# Patient Record
Sex: Female | Born: 1980 | Race: Black or African American | Hispanic: No | Marital: Single | Smoking: Former smoker
Health system: Southern US, Community
[De-identification: ages and names within clinical notes are randomized; demographics above are authoritative.]

## PROBLEM LIST (undated history)

## (undated) DIAGNOSIS — T8149XA Infection following a procedure, other surgical site, initial encounter: Secondary | ICD-10-CM

## (undated) DIAGNOSIS — O1002 Pre-existing essential hypertension complicating childbirth: Secondary | ICD-10-CM

## (undated) DIAGNOSIS — O24419 Gestational diabetes mellitus in pregnancy, unspecified control: Secondary | ICD-10-CM

## (undated) DIAGNOSIS — Q828 Other specified congenital malformations of skin: Secondary | ICD-10-CM

## (undated) DIAGNOSIS — R011 Cardiac murmur, unspecified: Secondary | ICD-10-CM

## (undated) DIAGNOSIS — N946 Dysmenorrhea, unspecified: Secondary | ICD-10-CM

## (undated) DIAGNOSIS — K5900 Constipation, unspecified: Secondary | ICD-10-CM

## (undated) DIAGNOSIS — Z3490 Encounter for supervision of normal pregnancy, unspecified, unspecified trimester: Secondary | ICD-10-CM

## (undated) DIAGNOSIS — O139 Gestational [pregnancy-induced] hypertension without significant proteinuria, unspecified trimester: Secondary | ICD-10-CM

## (undated) DIAGNOSIS — Z319 Encounter for procreative management, unspecified: Secondary | ICD-10-CM

## (undated) HISTORY — DX: Dysmenorrhea, unspecified: N94.6

## (undated) HISTORY — DX: Pre-existing essential hypertension complicating childbirth: O10.02

## (undated) HISTORY — DX: Infection following a procedure, other surgical site, initial encounter: T81.49XA

## (undated) HISTORY — DX: Encounter for procreative management, unspecified: Z31.9

## (undated) HISTORY — DX: Encounter for supervision of normal pregnancy, unspecified, unspecified trimester: Z34.90

## (undated) HISTORY — DX: Gestational (pregnancy-induced) hypertension without significant proteinuria, unspecified trimester: O13.9

## (undated) HISTORY — DX: Cardiac murmur, unspecified: R01.1

## (undated) HISTORY — DX: Constipation, unspecified: K59.00

## (undated) HISTORY — DX: Other specified congenital malformations of skin: Q82.8

## (undated) HISTORY — DX: Gestational diabetes mellitus in pregnancy, unspecified control: O24.419

## (undated) HISTORY — PX: OTHER SURGICAL HISTORY: SHX169

---

## 2001-06-11 ENCOUNTER — Emergency Department (HOSPITAL_COMMUNITY): Admission: EM | Admit: 2001-06-11 | Discharge: 2001-06-11 | Payer: Self-pay | Admitting: Internal Medicine

## 2001-08-04 ENCOUNTER — Other Ambulatory Visit: Admission: RE | Admit: 2001-08-04 | Discharge: 2001-08-04 | Payer: Self-pay | Admitting: Obstetrics and Gynecology

## 2003-04-25 ENCOUNTER — Other Ambulatory Visit: Admission: RE | Admit: 2003-04-25 | Discharge: 2003-04-25 | Payer: Self-pay

## 2012-06-07 ENCOUNTER — Other Ambulatory Visit: Payer: Self-pay | Admitting: Obstetrics & Gynecology

## 2012-06-07 DIAGNOSIS — O3680X0 Pregnancy with inconclusive fetal viability, not applicable or unspecified: Secondary | ICD-10-CM

## 2012-06-10 ENCOUNTER — Ambulatory Visit (INDEPENDENT_AMBULATORY_CARE_PROVIDER_SITE_OTHER): Payer: Medicaid Other

## 2012-06-10 DIAGNOSIS — O3680X0 Pregnancy with inconclusive fetal viability, not applicable or unspecified: Secondary | ICD-10-CM

## 2012-06-10 NOTE — Progress Notes (Signed)
U/S (7+2wks)-single IUP with +FCA noted, CRL c/w dates, cx long and closed, ??pelvic kidney noted on lt 4.5cm area noted with hyperechoic central appearance with +Doppler flow , rt ovary noted, unable to identify lt ovary,

## 2012-06-13 LAB — US OB COMP LESS 14 WKS

## 2012-06-21 ENCOUNTER — Encounter: Payer: Self-pay | Admitting: Adult Health

## 2012-06-21 ENCOUNTER — Ambulatory Visit (INDEPENDENT_AMBULATORY_CARE_PROVIDER_SITE_OTHER): Payer: Medicaid Other | Admitting: Adult Health

## 2012-06-21 VITALS — BP 120/72 | Ht 67.0 in | Wt 200.0 lb

## 2012-06-21 DIAGNOSIS — Z3401 Encounter for supervision of normal first pregnancy, first trimester: Secondary | ICD-10-CM

## 2012-06-21 DIAGNOSIS — Z3400 Encounter for supervision of normal first pregnancy, unspecified trimester: Secondary | ICD-10-CM

## 2012-06-21 DIAGNOSIS — Z3490 Encounter for supervision of normal pregnancy, unspecified, unspecified trimester: Secondary | ICD-10-CM

## 2012-06-21 HISTORY — DX: Encounter for supervision of normal pregnancy, unspecified, unspecified trimester: Z34.90

## 2012-06-21 NOTE — Progress Notes (Signed)
  Subjective:    Donna Moore is a G1P0 [redacted]w[redacted]d being seen today for her first obstetrical visit.  Her obstetrical history is significant for none.  Pregnancy history fully reviewed.  Patient reports nausea.  Filed Vitals:   06/21/12 1352 06/21/12 1358  BP: 120/72   Height:  5\' 7"  (1.702 m)  Weight: 200 lb (90.719 kg)     HISTORY: OB History   Grav Para Term Preterm Abortions TAB SAB Ect Mult Living   1              # Outc Date GA Lbr Len/2nd Wgt Sex Del Anes PTL Lv   1 CUR              Past Medical History  Diagnosis Date  . Heart murmur   . Pregnancy 06/21/2012   Past Surgical History  Procedure Laterality Date  . Cyst drained      Emergency surg, and in hospital for 1 week.   Family History  Problem Relation Age of Onset  . Hypertension Mother   . Diabetes Mother   . Hypertension Father   . Diabetes Brother      Exam       Pelvic Exam:    Perineum: deferred   Vulva: deferred   Vagina:  deferred   Uterus         Cervix: deferred   Adnexa: deferred   Urinary:  deferred    System: Breast:  deferred   Skin: normal coloration and turgor, no rashes    Neurologic: oriented, normal, normal mood   Extremities: normal strength, tone, and muscle mass   HEENT PERRLA   Mouth/Teeth mucous membranes moist, pharynx normal without lesions   Neck supple and no masses   Cardiovascular: regular rate and rhythm   Respiratory:  appears well, vitals normal, no respiratory distress, acyanotic, normal RR   Abdomen: soft, non-tender; bowel sounds normal; no masses,  no organomegaly          Assessment:    Pregnancy: G1P0 Patient Active Problem List   Diagnosis Date Noted  . Pregnancy 06/21/2012        Plan:    Declines meds for nausea Initial labs drawn. Prenatal vitamins. Problem list reviewed and updated. Genetic Screening discussed Integrated Screen: requested.  Ultrasound discussed; fetal survey: requested.  Follow up in 3  weeks.  GRIFFIN,JENNIFER 06/21/2012

## 2012-06-21 NOTE — Patient Instructions (Addendum)
Follow up in 3 weeks for IT/NT Morning Sickness Morning sickness is when you feel sick to your stomach (nauseous) during pregnancy. This nauseous feeling may or may not come with throwing up (vomiting). It often occurs in the morning, but can be a problem any time of day. While morning sickness is unpleasant, it is usually harmless unless you develop severe and continual vomiting (hyperemesis gravidarum). This condition requires more intense treatment. CAUSES  The cause of morning sickness is not completely known but seems to be related to a sudden increase of two hormones:   Human chorionic gonadotropin (hCG).  Estrogen hormone. These are elevated in the first part of the pregnancy. TREATMENT  Do not use any medicines (prescription, over-the-counter, or herbal) for morning sickness without first talking to your caregiver. Some patients are helped by the following:  Vitamin B6 (25mg  every 8 hours) or vitamin B6 shots.  An antihistamine called doxylamine (10mg  every 8 hours).  The herbal medication ginger. HOME CARE INSTRUCTIONS   Taking multivitamins before getting pregnant can prevent or decrease the severity of morning sickness in most women.  Eat a piece of dry toast or unsalted crackers before getting out of bed in the morning.  Eat 5 or 6 small meals a day.  Eat dry and bland foods (rice, baked potato).  Do not drink liquids with your meals. Drink liquids between meals.  Avoid greasy, fatty, and spicy foods.  Get someone to cook for you if the smell of any food causes nausea and vomiting.  Avoid vitamin pills with iron because iron can cause nausea.  Snack on protein foods between meals if you are hungry.  Eat unsweetened gelatins for deserts.  Wear an acupressure wristband (worn for sea sickness) may be helpful.  Acupuncture may be helpful.  Do not smoke.  Get a humidifier to keep the air in your house free of odors. SEEK MEDICAL CARE IF:   Your home remedies  are not working and you need medication.  You feel dizzy or lightheaded.  You are losing weight.  You need help with your diet. SEEK IMMEDIATE MEDICAL CARE IF:   You have persistent and uncontrolled nausea and vomiting.  You pass out (faint).  You have a fever. MAKE SURE YOU:   Understand these instructions.  Will watch your condition.  Will get help right away if you are not doing well or get worse. Document Released: 03/27/2006 Document Revised: 04/28/2011 Document Reviewed: 01/22/2007 [facility] Patient Information 2013 [place], [place]. Sign for my chart

## 2012-06-21 NOTE — Progress Notes (Signed)
Pt in today for New OB appointment. Pt was given [facility] form and Lab consents to read over and sign. Pt has no complaints at this time.

## 2012-06-22 LAB — CBC
HCT: 35.8 % — ABNORMAL LOW (ref 36.0–46.0)
Platelets: 304 10*3/uL (ref 150–400)
RDW: 15.7 % — ABNORMAL HIGH (ref 11.5–15.5)
WBC: 10 10*3/uL (ref 4.0–10.5)

## 2012-06-22 LAB — HEPATITIS B SURFACE ANTIGEN: Hepatitis B Surface Ag: NEGATIVE

## 2012-06-22 LAB — HIV ANTIBODY (ROUTINE TESTING W REFLEX): HIV: NONREACTIVE

## 2012-06-22 LAB — ABO AND RH

## 2012-06-22 LAB — TSH: TSH: 1.113 u[IU]/mL (ref 0.350–4.500)

## 2012-06-22 LAB — RUBELLA SCREEN: Rubella: 4.41 Index — ABNORMAL HIGH (ref <0.90)

## 2012-06-23 ENCOUNTER — Other Ambulatory Visit: Payer: Self-pay | Admitting: Obstetrics & Gynecology

## 2012-06-23 DIAGNOSIS — Z36 Encounter for antenatal screening of mother: Secondary | ICD-10-CM

## 2012-07-13 ENCOUNTER — Other Ambulatory Visit: Payer: Self-pay

## 2012-07-13 ENCOUNTER — Encounter: Payer: Self-pay | Admitting: Obstetrics and Gynecology

## 2012-07-14 ENCOUNTER — Ambulatory Visit (INDEPENDENT_AMBULATORY_CARE_PROVIDER_SITE_OTHER): Payer: Medicaid Other

## 2012-07-14 ENCOUNTER — Other Ambulatory Visit: Payer: Self-pay | Admitting: Obstetrics & Gynecology

## 2012-07-14 ENCOUNTER — Ambulatory Visit (INDEPENDENT_AMBULATORY_CARE_PROVIDER_SITE_OTHER): Payer: Medicaid Other | Admitting: Obstetrics & Gynecology

## 2012-07-14 ENCOUNTER — Encounter: Payer: Self-pay | Admitting: Obstetrics & Gynecology

## 2012-07-14 VITALS — BP 120/80 | Wt 206.0 lb

## 2012-07-14 DIAGNOSIS — Z331 Pregnant state, incidental: Secondary | ICD-10-CM

## 2012-07-14 DIAGNOSIS — Z3400 Encounter for supervision of normal first pregnancy, unspecified trimester: Secondary | ICD-10-CM

## 2012-07-14 DIAGNOSIS — Z1389 Encounter for screening for other disorder: Secondary | ICD-10-CM

## 2012-07-14 DIAGNOSIS — Z36 Encounter for antenatal screening of mother: Secondary | ICD-10-CM

## 2012-07-14 DIAGNOSIS — Z3401 Encounter for supervision of normal first pregnancy, first trimester: Secondary | ICD-10-CM

## 2012-07-14 LAB — POCT URINALYSIS DIPSTICK
Glucose, UA: NEGATIVE
Nitrite, UA: NEGATIVE

## 2012-07-14 MED ORDER — PRENATAL PLUS 27-1 MG PO TABS: 1.0000 | ORAL_TABLET | Freq: Every day | ORAL | Status: DC

## 2012-07-14 NOTE — Addendum Note (Signed)
Addended by: Colen Darling on: 07/14/2012 03:27 PM   Modules accepted: Orders

## 2012-07-14 NOTE — Progress Notes (Signed)
No bleeding no problems NT sono noted and all WNL First IT today

## 2012-07-14 NOTE — Progress Notes (Signed)
U/S-12+1wks-single IUP with +FCA noted, CRL c/w dates, cx long and closed, bilateral adnexa wnl, NB present, NT=1.48mm

## 2012-07-14 NOTE — Progress Notes (Signed)
1st IT today.

## 2012-07-15 LAB — URINALYSIS
Leukocytes, UA: NEGATIVE
Nitrite: NEGATIVE
Protein, ur: NEGATIVE mg/dL
Urobilinogen, UA: 0.2 mg/dL (ref 0.0–1.0)

## 2012-07-15 LAB — DRUG SCREEN, URINE, NO CONFIRMATION
Amphetamine Screen, Ur: NEGATIVE
Barbiturate Quant, Ur: NEGATIVE
Marijuana Metabolite: NEGATIVE
Methadone: NEGATIVE

## 2012-07-16 LAB — URINE CULTURE: Colony Count: NO GROWTH

## 2012-07-19 LAB — MATERNAL SCREEN, INTEGRATED #1

## 2012-08-11 ENCOUNTER — Encounter: Payer: Self-pay | Admitting: Obstetrics & Gynecology

## 2012-08-11 ENCOUNTER — Ambulatory Visit (INDEPENDENT_AMBULATORY_CARE_PROVIDER_SITE_OTHER): Payer: Medicaid Other | Admitting: Obstetrics & Gynecology

## 2012-08-11 ENCOUNTER — Other Ambulatory Visit: Payer: Self-pay | Admitting: Obstetrics & Gynecology

## 2012-08-11 VITALS — BP 144/78 | Wt 217.0 lb

## 2012-08-11 DIAGNOSIS — Z331 Pregnant state, incidental: Secondary | ICD-10-CM

## 2012-08-11 DIAGNOSIS — Z1389 Encounter for screening for other disorder: Secondary | ICD-10-CM

## 2012-08-11 DIAGNOSIS — Z3400 Encounter for supervision of normal first pregnancy, unspecified trimester: Secondary | ICD-10-CM

## 2012-08-11 LAB — POCT URINALYSIS DIPSTICK
Blood, UA: NEGATIVE
Glucose, UA: NEGATIVE
Ketones, UA: NEGATIVE
Nitrite, UA: NEGATIVE

## 2012-08-11 NOTE — Progress Notes (Signed)
BP creeping a little BP weight and urine results all reviewed and noted. Patient reports good fetal movement, denies any bleeding and no rupture of membranes symptoms or regular contractions. Patient is without complaints. All questions were answered. Sonogram in 4 weeks

## 2012-08-11 NOTE — Progress Notes (Signed)
2 IT TODAY.

## 2012-08-11 NOTE — Patient Instructions (Signed)
How a Baby Grows During Pregnancy Pregnancy begins when the female's sperm enters the female's egg. This happens in the fallopian tube and is called fertilization. The fertilized egg is called an embryo until it reaches 9 weeks from the time of fertilization. From 9 weeks until birth it is called a fetus. The fertilized egg moves down the tube into the uterus and attaches to the inside lining of the uterus.  The pregnant woman is responsible for the growth of the embryo/fetus by supplying nourishment and oxygen through the blood stream and placenta to the developing fetus. The uterus becomes larger and pops out from the abdomen more and more as the fetus develops and grows. A normal pregnancy lasts 280 days, with a range of 259 to 294 days, or 40 weeks. The pregnancy is divided up into three trimesters:  First trimester - 0 to 13 weeks.  Second trimester - 14 to 27 weeks.  Third trimester - 28 to 40 weeks. The day your baby is supposed to be born is called estimated date of confinement (EDC) or estimated date of delivery (EDD). GROWTH OF THE BABY MONTH BY MONTH 1. First Month: The fertilized egg attaches to the inside of the uterus and certain cells will form the placenta and others will develop into the fetus. The arms, legs, brain, spinal cord, lungs, and heart begin to develop. At the end of the first month the heart begins to beat. The embryo weighs less than an ounce and is  inch long. 2. Second Month: The bones can be seen, the inner ear, eye lids, hands and feet form and genitals develop. By the end of 8 weeks, all of the major organs are developing. The fetus now weighs less than an ounce and is one inch (2.54 cm) long. 3. Third Month: Teeth buds appear, all the internal organs are forming, bones and muscles begin to grow, the spine can flex and the skin is transparent. Finger and toe nails begin to form, the hands develop faster than the feet and the arms are longer than the legs at this point.  The fetus weighs a little more than an ounce (0.03 kg) and is 3 inches (8.89cm) long. 4. Fourth Month: The placenta is completely formed. The external sex organs, neck, outer ear, eyebrows, eyelids and fingernails are formed. The fetus can hear, swallow, flex its arms and legs and the kidney begins to produce urine. The skin is covered with a white waxy coating (vernix) and very thin hair (lanugo) is present. The fetus weighs 5 ounces (0.14kg) and is 6 to 7 inches (16.51cm) long. 5. Fifth Month: The fetus moves around more and can be felt for the first time (called quickening), sleeps and wakes up at times, may begin to suck its finger and the nails grow to the end of the fingers. The gallbladder is now functioning and helps to digest the nutrients, eggs are formed in the female and the testicles begin to drop down from the abdomen to the scrotum in the female. The fetus weighs  to 1 pound (0.45kg) and is 10 inches (25.4cm) long. 6. Sixth Month: The lungs are formed but the fetus does not breath yet. The eyes open, the brain develops more quickly at this time, one can detect finger and toe prints and thicker hair grows. The fetus weighs 1 to 1 pounds (0.68kg) and is 12 inches (30.48cm) long. 7. Seventh Month: The fetus can hear and respond to sounds, kicks and stretches and can sense   changes in light. The fetus weighs 2 to 2 pounds (1.13kg) and is 14 inches (35.56cm) long. 8. Eight Month: All organs and body systems are fully developed and functioning. The bones get harder, taste buds develop and can taste sweet and sour flavors and the fetus may hiccup now. Different parts of the brain are developing and the skull remains soft for the brain to grow. The fetus weighs 5 pounds (2.27kg) and is 18 inches (45.75cm) long. 9. Ninth Month: The fetus gains about a half a pound a week, the lungs are fully developed, patterns of sleep develop and the head moves down into the bottom of the uterus called vertex. If the  buttocks moves into the bottom of the uterus, it is called a breech. The fetus weighs 6 to 9 pounds (2.72 to 4.08kg) and is 20 inches (50.8cm) long. You should be informed about your pregnancy, yourself and how the baby is developing as much as possible. Being informed helps you to enjoy this experience. It also gives you the sense to feel if something is not going right and when to ask questions. Talk to your caregiver when you have questions about your baby or your own body. Document Released: 07/23/2007 Document Revised: 04/28/2011 Document Reviewed: 07/23/2007 [employer] Patient Information 2014 [employer].  

## 2012-08-15 LAB — MATERNAL SCREEN, INTEGRATED #2
Age risk Down Syndrome: 1:510 {titer}
Calculated Gestational Age: 16.1
Inhibin A Dimeric: 108 pg/mL
Inhibin A MoM: 0.73
MSS Down Syndrome: <1:5000 {titer}
NT MoM: 1.05
Rish for ONTD: <1:5000 {titer}
hCG MoM: 0.71

## 2012-09-08 ENCOUNTER — Ambulatory Visit (INDEPENDENT_AMBULATORY_CARE_PROVIDER_SITE_OTHER): Payer: Medicaid Other

## 2012-09-08 ENCOUNTER — Encounter: Payer: Self-pay | Admitting: Obstetrics and Gynecology

## 2012-09-08 ENCOUNTER — Ambulatory Visit (INDEPENDENT_AMBULATORY_CARE_PROVIDER_SITE_OTHER): Payer: Medicaid Other | Admitting: Obstetrics and Gynecology

## 2012-09-08 ENCOUNTER — Other Ambulatory Visit: Payer: Self-pay | Admitting: Obstetrics & Gynecology

## 2012-09-08 VITALS — BP 136/80 | Wt 228.6 lb

## 2012-09-08 DIAGNOSIS — Z0371 Encounter for suspected problem with amniotic cavity and membrane ruled out: Secondary | ICD-10-CM

## 2012-09-08 DIAGNOSIS — Z331 Pregnant state, incidental: Secondary | ICD-10-CM

## 2012-09-08 DIAGNOSIS — Z1389 Encounter for screening for other disorder: Secondary | ICD-10-CM

## 2012-09-08 DIAGNOSIS — O99210 Obesity complicating pregnancy, unspecified trimester: Secondary | ICD-10-CM

## 2012-09-08 DIAGNOSIS — Z0375 Encounter for suspected cervical shortening ruled out: Secondary | ICD-10-CM

## 2012-09-08 LAB — POCT URINALYSIS DIPSTICK
Blood, UA: NEGATIVE
Ketones, UA: NEGATIVE

## 2012-09-08 NOTE — Progress Notes (Signed)
U/S(20+1wks)-active fetus, vtx, approp growth, meas c/w dates, fluid wnl, bilateral adnexa wnl, no major abnl noted, female fetus, cx  Closed, 3cm (measured vaginally)

## 2012-09-08 NOTE — Progress Notes (Signed)
Pt here today for routine visit and [place]. Pt denies any problems or concerns at this time.

## 2012-09-08 NOTE — Patient Instructions (Signed)
Baby requires 300 calories  A day

## 2012-09-27 ENCOUNTER — Ambulatory Visit (INDEPENDENT_AMBULATORY_CARE_PROVIDER_SITE_OTHER): Payer: Medicaid Other | Admitting: Obstetrics & Gynecology

## 2012-09-27 ENCOUNTER — Encounter: Payer: Self-pay | Admitting: Obstetrics & Gynecology

## 2012-09-27 ENCOUNTER — Telehealth: Payer: Self-pay | Admitting: *Deleted

## 2012-09-27 VITALS — BP 130/80 | Wt 234.0 lb

## 2012-09-27 DIAGNOSIS — K5900 Constipation, unspecified: Secondary | ICD-10-CM

## 2012-09-27 DIAGNOSIS — Z331 Pregnant state, incidental: Secondary | ICD-10-CM

## 2012-09-27 DIAGNOSIS — Z1389 Encounter for screening for other disorder: Secondary | ICD-10-CM

## 2012-09-27 DIAGNOSIS — O9989 Other specified diseases and conditions complicating pregnancy, childbirth and the puerperium: Secondary | ICD-10-CM

## 2012-09-27 LAB — POCT URINALYSIS DIPSTICK
Blood, UA: NEGATIVE
Glucose, UA: NEGATIVE

## 2012-09-27 MED ORDER — POLYETHYLENE GLYCOL 3350 17 GM/SCOOP PO POWD: 17.0000 g | Freq: Every day | ORAL | Status: DC

## 2012-09-27 NOTE — Telephone Encounter (Signed)
Spoke with pt. Was having upper abd pain and pressure over the weekend. Went to [facility] ER. Constipated and when she had a BM, it had blood and mucus in it. Blood in urine also. [facility] advised it was related to being constipated and to follow up with [place] today. Still having stomach cramps today. Last BM was Saturday. Appt scheduled for follow up today.

## 2012-09-27 NOTE — Addendum Note (Signed)
Addended by: Garfield Chiquito on: 09/27/2012 04:58 PM   Modules accepted: Orders

## 2012-09-27 NOTE — Progress Notes (Signed)
Went to [facility] on Friday. For constipation. Still having pain and cramping." states no fetal movement today.

## 2012-09-27 NOTE — Patient Instructions (Signed)

## 2012-09-27 NOTE — Progress Notes (Signed)
Fetus active during Doppler.  Recommend suppositories followed by MagCitrate acutely then miralax powder chronically, 1-4 scoops per day.

## 2012-10-05 ENCOUNTER — Encounter: Payer: Self-pay | Admitting: Women's Health

## 2012-10-05 ENCOUNTER — Ambulatory Visit (INDEPENDENT_AMBULATORY_CARE_PROVIDER_SITE_OTHER): Payer: Medicaid Other | Admitting: Women's Health

## 2012-10-05 VITALS — BP 142/80 | Wt 237.4 lb

## 2012-10-05 DIAGNOSIS — O0990 Supervision of high risk pregnancy, unspecified, unspecified trimester: Secondary | ICD-10-CM | POA: Insufficient documentation

## 2012-10-05 DIAGNOSIS — O10019 Pre-existing essential hypertension complicating pregnancy, unspecified trimester: Secondary | ICD-10-CM

## 2012-10-05 DIAGNOSIS — Z331 Pregnant state, incidental: Secondary | ICD-10-CM

## 2012-10-05 DIAGNOSIS — O10912 Unspecified pre-existing hypertension complicating pregnancy, second trimester: Secondary | ICD-10-CM

## 2012-10-05 DIAGNOSIS — O10919 Unspecified pre-existing hypertension complicating pregnancy, unspecified trimester: Secondary | ICD-10-CM | POA: Insufficient documentation

## 2012-10-05 DIAGNOSIS — Z1389 Encounter for screening for other disorder: Secondary | ICD-10-CM

## 2012-10-05 DIAGNOSIS — Z3402 Encounter for supervision of normal first pregnancy, second trimester: Secondary | ICD-10-CM

## 2012-10-05 LAB — POCT URINALYSIS DIPSTICK
Ketones, UA: NEGATIVE
Protein, UA: NEGATIVE

## 2012-10-05 NOTE — Progress Notes (Signed)
Reports good fm. Denies uc's, lof, vb, urinary frequency, urgency, hesitancy, or dysuria.  No complaints. States constipation much better. Reviewed BPs, probable CHTN and management, ptl s/s, fm.  Baseline 24hr urine ordered today. All questions answered. F/U in 4wks for PN2, u/s, and visit.

## 2012-10-05 NOTE — Patient Instructions (Addendum)
You will have your sugar test next visit.  Please do not eat or drink anything after midnight the night before you come, not even water.  You will be here for at least two hours.    Pregnancy - Second Trimester The second trimester of pregnancy (3 to 6 months) is a period of rapid growth for you and your baby. At the end of the sixth month, your baby is about 9 inches long and weighs 1 1/2 pounds. You will begin to feel the baby move between 18 and 20 weeks of the pregnancy. This is called quickening. Weight gain is faster. A clear fluid (colostrum) may leak out of your breasts. You may feel small contractions of the womb (uterus). This is known as false labor or Braxton-Hicks contractions. This is like a practice for labor when the baby is ready to be born. Usually, the problems with morning sickness have usually passed by the end of your first trimester. Some women develop small dark blotches (called cholasma, mask of pregnancy) on their face that usually goes away after the baby is born. Exposure to the sun makes the blotches worse. Acne may also develop in some pregnant women and pregnant women who have acne, may find that it goes away. PRENATAL EXAMS  Blood work may continue to be done during prenatal exams. These tests are done to check on your health and the probable health of your baby. Blood work is used to follow your blood levels (hemoglobin). Anemia (low hemoglobin) is common during pregnancy. Iron and vitamins are given to help prevent this. You will also be checked for diabetes between 24 and 28 weeks of the pregnancy. Some of the previous blood tests may be repeated.  The size of the uterus is measured during each visit. This is to make sure that the baby is continuing to grow properly according to the dates of the pregnancy.  Your blood pressure is checked every prenatal visit. This is to make sure you are not getting toxemia.  Your urine is checked to make sure you do not have an  infection, diabetes or protein in the urine.  Your weight is checked often to make sure gains are happening at the suggested rate. This is to ensure that both you and your baby are growing normally.  Sometimes, an ultrasound is performed to confirm the proper growth and development of the baby. This is a test which bounces harmless sound waves off the baby so your caregiver can more accurately determine due dates. Sometimes, a test is done on the amniotic fluid surrounding the baby. This test is called an amniocentesis. The amniotic fluid is obtained by sticking a needle into the belly (abdomen). This is done to check the chromosomes in instances where there is a concern about possible genetic problems with the baby. It is also sometimes done near the end of pregnancy if an early delivery is required. In this case, it is done to help make sure the baby's lungs are mature enough for the baby to live outside of the womb. CHANGES OCCURING IN THE SECOND TRIMESTER OF PREGNANCY Your body goes through many changes during pregnancy. They vary from person to person. Talk to your caregiver about changes you notice that you are concerned about.  During the second trimester, you will likely have an increase in your appetite. It is normal to have cravings for certain foods. This varies from person to person and pregnancy to pregnancy.  Your lower abdomen will begin to   bulge.  You may have to urinate more often because the uterus and baby are pressing on your bladder. It is also common to get more bladder infections during pregnancy. You can help this by drinking lots of fluids and emptying your bladder before and after intercourse.  You may begin to get stretch marks on your hips, abdomen, and breasts. These are normal changes in the body during pregnancy. There are no exercises or medicines to take that prevent this change.  You may begin to develop swollen and bulging veins (varicose veins) in your legs.  Wearing support hose, elevating your feet for 15 minutes, 3 to 4 times a day and limiting salt in your diet helps lessen the problem.  Heartburn may develop as the uterus grows and pushes up against the stomach. Antacids recommended by your caregiver helps with this problem. Also, eating smaller meals 4 to 5 times a day helps.  Constipation can be treated with a stool softener or adding bulk to your diet. Drinking lots of fluids, and eating vegetables, fruits, and whole grains are helpful.  Exercising is also helpful. If you have been very active up until your pregnancy, most of these activities can be continued during your pregnancy. If you have been less active, it is helpful to start an exercise program such as walking.  Hemorrhoids may develop at the end of the second trimester. Warm sitz baths and hemorrhoid cream recommended by your caregiver helps hemorrhoid problems.  Backaches may develop during this time of your pregnancy. Avoid heavy lifting, wear low heal shoes, and practice good posture to help with backache problems.  Some pregnant women develop tingling and numbness of their hand and fingers because of swelling and tightening of ligaments in the wrist (carpel tunnel syndrome). This goes away after the baby is born.  As your breasts enlarge, you may have to get a bigger bra. Get a comfortable, cotton, support bra. Do not get a nursing bra until the last month of the pregnancy if you will be nursing the baby.  You may get a dark line from your belly button to the pubic area called the linea nigra.  You may develop rosy cheeks because of increase blood flow to the face.  You may develop spider looking lines of the face, neck, arms, and chest. These go away after the baby is born. HOME CARE INSTRUCTIONS   It is extremely important to avoid all smoking, herbs, alcohol, and unprescribed drugs during your pregnancy. These chemicals affect the formation and growth of the baby. Avoid  these chemicals throughout the pregnancy to ensure the delivery of a healthy infant.  Most of your home care instructions are the same as suggested for the first trimester of your pregnancy. Keep your caregiver's appointments. Follow your caregiver's instructions regarding medicine use, exercise, and diet.  During pregnancy, you are providing food for you and your baby. Continue to eat regular, well-balanced meals. Choose foods such as meat, fish, milk and other low fat dairy products, vegetables, fruits, and whole-grain breads and cereals. Your caregiver will tell you of the ideal weight gain.  A physical sexual relationship may be continued up until near the end of pregnancy if there are no other problems. Problems could include early (premature) leaking of amniotic fluid from the membranes, vaginal bleeding, abdominal pain, or other medical or pregnancy problems.  Exercise regularly if there are no restrictions. Check with your caregiver if you are unsure of the safety of some of your exercises. The   greatest weight gain will occur in the last 2 trimesters of pregnancy. Exercise will help you:  Control your weight.  Get you in shape for labor and delivery.  Lose weight after you have the baby.  Wear a good support or jogging bra for breast tenderness during pregnancy. This may help if worn during sleep. Pads or tissues may be used in the bra if you are leaking colostrum.  Do not use hot tubs, steam rooms or saunas throughout the pregnancy.  Wear your seat belt at all times when driving. This protects you and your baby if you are in an accident.  Avoid raw meat, uncooked cheese, cat litter boxes, and soil used by cats. These carry germs that can cause birth defects in the baby.  The second trimester is also a good time to visit your dentist for your dental health if this has not been done yet. Getting your teeth cleaned is okay. Use a soft toothbrush. Brush gently during pregnancy.  It is  easier to leak urine during pregnancy. Tightening up and strengthening the pelvic muscles will help with this problem. Practice stopping your urination while you are going to the bathroom. These are the same muscles you need to strengthen. It is also the muscles you would use as if you were trying to stop from passing gas. You can practice tightening these muscles up 10 times a set and repeating this about 3 times per day. Once you know what muscles to tighten up, do not perform these exercises during urination. It is more likely to contribute to an infection by backing up the urine.  Ask for help if you have financial, counseling, or nutritional needs during pregnancy. Your caregiver will be able to offer counseling for these needs as well as refer you for other special needs.  Your skin may become oily. If so, wash your face with mild soap, use non-greasy moisturizer and oil or cream based makeup. MEDICINES AND DRUG USE IN PREGNANCY  Take prenatal vitamins as directed. The vitamin should contain 1 milligram of folic acid. Keep all vitamins out of reach of children. Only a couple vitamins or tablets containing iron may be fatal to a baby or young child when ingested.  Avoid use of all medicines, including herbs, over-the-counter medicines, not prescribed or suggested by your caregiver. Only take over-the-counter or prescription medicines for pain, discomfort, or fever as directed by your caregiver. Do not use aspirin.  Let your caregiver also know about herbs you may be using.  Alcohol is related to a number of birth defects. This includes fetal alcohol syndrome. All alcohol, in any form, should be avoided completely. Smoking will cause low birth rate and premature babies.  Street or illegal drugs are very harmful to the baby. They are absolutely forbidden. A baby born to an addicted mother will be addicted at birth. The baby will go through the same withdrawal an adult does. SEEK MEDICAL CARE IF:    You have any concerns or worries during your pregnancy. It is better to call with your questions if you feel they cannot wait, rather than worry about them. SEEK IMMEDIATE MEDICAL CARE IF:   An unexplained oral temperature above 102 F (38.9 C) develops, or as your caregiver suggests.  You have leaking of fluid from the vagina (birth canal). If leaking membranes are suspected, take your temperature and tell your caregiver of this when you call.  There is vaginal spotting, bleeding, or passing clots. Tell your caregiver of   the amount and how many pads are used. Light spotting in pregnancy is common, especially following intercourse.  You develop a bad smelling vaginal discharge with a change in the color from clear to white.  You continue to feel sick to your stomach (nauseated) and have no relief from remedies suggested. You vomit blood or coffee ground-like materials.  You lose more than 2 pounds of weight or gain more than 2 pounds of weight over 1 week, or as suggested by your caregiver.  You notice swelling of your face, hands, feet, or legs.  You get exposed to German measles and have never had them.  You are exposed to fifth disease or chickenpox.  You develop belly (abdominal) pain. Round ligament discomfort is a common non-cancerous (benign) cause of abdominal pain in pregnancy. Your caregiver still must evaluate you.  You develop a bad headache that does not go away.  You develop fever, diarrhea, pain with urination, or shortness of breath.  You develop visual problems, blurry, or double vision.  You fall or are in a car accident or any kind of trauma.  There is mental or physical violence at home. Document Released: 01/28/2001 Document Revised: 10/29/2011 Document Reviewed: 08/02/2008 [employer] Patient Information 2014 [employer].  

## 2012-10-07 ENCOUNTER — Other Ambulatory Visit: Payer: Medicaid Other

## 2012-10-07 DIAGNOSIS — O10012 Pre-existing essential hypertension complicating pregnancy, second trimester: Secondary | ICD-10-CM

## 2012-10-08 LAB — CREATININE CLEARANCE, URINE, 24 HOUR
Creatinine Clearance: 225 mL/min — ABNORMAL HIGH (ref 75–115)
Creatinine, 24H Ur: 2333 mg/d — ABNORMAL HIGH (ref 700–1800)

## 2012-10-08 LAB — PROTEIN, URINE, 24 HOUR
Protein, 24H Urine: 125 mg/d — ABNORMAL HIGH (ref 50–100)
Protein, Urine: 5 mg/dL

## 2012-10-11 ENCOUNTER — Telehealth: Payer: Self-pay | Admitting: Obstetrics & Gynecology

## 2012-10-11 NOTE — Telephone Encounter (Signed)
Spoke with pt. Cold symptoms, stuffy nose, trouble breathing from the stuffiness, sorethroat x 2 days. + fever, + wheezing. Advised pt be seen at [facility] at [facility]. Pt states she is in [place] and wants to go to [place]. Advised that was fine. JSY

## 2012-10-12 ENCOUNTER — Telehealth: Payer: Self-pay | Admitting: Obstetrics and Gynecology

## 2012-10-12 NOTE — Telephone Encounter (Signed)
Spoke with pt. Was seen at [facility] ER last pm for bronchitis. They wanted her to follow up here. Appt scheduled for end of the week. JSY

## 2012-10-13 ENCOUNTER — Ambulatory Visit (INDEPENDENT_AMBULATORY_CARE_PROVIDER_SITE_OTHER): Payer: Medicaid Other | Admitting: Obstetrics & Gynecology

## 2012-10-13 ENCOUNTER — Encounter: Payer: Self-pay | Admitting: Obstetrics & Gynecology

## 2012-10-13 VITALS — BP 130/60 | Wt 240.0 lb

## 2012-10-13 DIAGNOSIS — Z3402 Encounter for supervision of normal first pregnancy, second trimester: Secondary | ICD-10-CM

## 2012-10-13 DIAGNOSIS — E669 Obesity, unspecified: Secondary | ICD-10-CM

## 2012-10-13 DIAGNOSIS — Z1389 Encounter for screening for other disorder: Secondary | ICD-10-CM

## 2012-10-13 DIAGNOSIS — O10019 Pre-existing essential hypertension complicating pregnancy, unspecified trimester: Secondary | ICD-10-CM

## 2012-10-13 DIAGNOSIS — Z331 Pregnant state, incidental: Secondary | ICD-10-CM

## 2012-10-13 LAB — POCT URINALYSIS DIPSTICK
Protein, UA: NEGATIVE
Urobilinogen, UA: 0.2

## 2012-10-13 MED ORDER — FLUCONAZOLE 150 MG PO TABS: 150.0000 mg | ORAL_TABLET | Freq: Once | ORAL | Status: DC

## 2012-10-13 NOTE — Progress Notes (Signed)
BP weight and urine results all reviewed and noted. Patient reports good fetal movement, denies any bleeding and no rupture of membranes symptoms or regular contractions. Patient is without complaints. All questions were answered.  

## 2012-10-13 NOTE — Progress Notes (Signed)
Follow-up from [facility] , C/C head and chest congestion.

## 2012-10-13 NOTE — Patient Instructions (Signed)
Pregnancy - Second Trimester The second trimester of pregnancy (3 to 6 months) is a period of rapid growth for you and your baby. At the end of the sixth month, your baby is about 9 inches long and weighs 1 1/2 pounds. You will begin to feel the baby move between 18 and 20 weeks of the pregnancy. This is called quickening. Weight gain is faster. A clear fluid (colostrum) may leak out of your breasts. You may feel small contractions of the womb (uterus). This is known as false labor or Braxton-Hicks contractions. This is like a practice for labor when the baby is ready to be born. Usually, the problems with morning sickness have usually passed by the end of your first trimester. Some women develop small dark blotches (called cholasma, mask of pregnancy) on their face that usually goes away after the baby is born. Exposure to the sun makes the blotches worse. Acne may also develop in some pregnant women and pregnant women who have acne, may find that it goes away. PRENATAL EXAMS  Blood work may continue to be done during prenatal exams. These tests are done to check on your health and the probable health of your baby. Blood work is used to follow your blood levels (hemoglobin). Anemia (low hemoglobin) is common during pregnancy. Iron and vitamins are given to help prevent this. You will also be checked for diabetes between 24 and 28 weeks of the pregnancy. Some of the previous blood tests may be repeated.  The size of the uterus is measured during each visit. This is to make sure that the baby is continuing to grow properly according to the dates of the pregnancy.  Your blood pressure is checked every prenatal visit. This is to make sure you are not getting toxemia.  Your urine is checked to make sure you do not have an infection, diabetes or protein in the urine.  Your weight is checked often to make sure gains are happening at the suggested rate. This is to ensure that both you and your baby are  growing normally.  Sometimes, an ultrasound is performed to confirm the proper growth and development of the baby. This is a test which bounces harmless sound waves off the baby so your caregiver can more accurately determine due dates. Sometimes, a test is done on the amniotic fluid surrounding the baby. This test is called an amniocentesis. The amniotic fluid is obtained by sticking a needle into the belly (abdomen). This is done to check the chromosomes in instances where there is a concern about possible genetic problems with the baby. It is also sometimes done near the end of pregnancy if an early delivery is required. In this case, it is done to help make sure the baby's lungs are mature enough for the baby to live outside of the womb. CHANGES OCCURING IN THE SECOND TRIMESTER OF PREGNANCY Your body goes through many changes during pregnancy. They vary from person to person. Talk to your caregiver about changes you notice that you are concerned about.  During the second trimester, you will likely have an increase in your appetite. It is normal to have cravings for certain foods. This varies from person to person and pregnancy to pregnancy.  Your lower abdomen will begin to bulge.  You may have to urinate more often because the uterus and baby are pressing on your bladder. It is also common to get more bladder infections during pregnancy. You can help this by drinking lots of fluids   and emptying your bladder before and after intercourse.  You may begin to get stretch marks on your hips, abdomen, and breasts. These are normal changes in the body during pregnancy. There are no exercises or medicines to take that prevent this change.  You may begin to develop swollen and bulging veins (varicose veins) in your legs. Wearing support hose, elevating your feet for 15 minutes, 3 to 4 times a day and limiting salt in your diet helps lessen the problem.  Heartburn may develop as the uterus grows and  pushes up against the stomach. Antacids recommended by your caregiver helps with this problem. Also, eating smaller meals 4 to 5 times a day helps.  Constipation can be treated with a stool softener or adding bulk to your diet. Drinking lots of fluids, and eating vegetables, fruits, and whole grains are helpful.  Exercising is also helpful. If you have been very active up until your pregnancy, most of these activities can be continued during your pregnancy. If you have been less active, it is helpful to start an exercise program such as walking.  Hemorrhoids may develop at the end of the second trimester. Warm sitz baths and hemorrhoid cream recommended by your caregiver helps hemorrhoid problems.  Backaches may develop during this time of your pregnancy. Avoid heavy lifting, wear low heal shoes, and practice good posture to help with backache problems.  Some pregnant women develop tingling and numbness of their hand and fingers because of swelling and tightening of ligaments in the wrist (carpel tunnel syndrome). This goes away after the baby is born.  As your breasts enlarge, you may have to get a bigger bra. Get a comfortable, cotton, support bra. Do not get a nursing bra until the last month of the pregnancy if you will be nursing the baby.  You may get a dark line from your belly button to the pubic area called the linea nigra.  You may develop rosy cheeks because of increase blood flow to the face.  You may develop spider looking lines of the face, neck, arms, and chest. These go away after the baby is born. HOME CARE INSTRUCTIONS   It is extremely important to avoid all smoking, herbs, alcohol, and unprescribed drugs during your pregnancy. These chemicals affect the formation and growth of the baby. Avoid these chemicals throughout the pregnancy to ensure the delivery of a healthy infant.  Most of your home care instructions are the same as suggested for the first trimester of your  pregnancy. Keep your caregiver's appointments. Follow your caregiver's instructions regarding medicine use, exercise, and diet.  During pregnancy, you are providing food for you and your baby. Continue to eat regular, well-balanced meals. Choose foods such as meat, fish, milk and other low fat dairy products, vegetables, fruits, and whole-grain breads and cereals. Your caregiver will tell you of the ideal weight gain.  A physical sexual relationship may be continued up until near the end of pregnancy if there are no other problems. Problems could include early (premature) leaking of amniotic fluid from the membranes, vaginal bleeding, abdominal pain, or other medical or pregnancy problems.  Exercise regularly if there are no restrictions. Check with your caregiver if you are unsure of the safety of some of your exercises. The greatest weight gain will occur in the last 2 trimesters of pregnancy. Exercise will help you:  Control your weight.  Get you in shape for labor and delivery.  Lose weight after you have the baby.  Wear   a good support or jogging bra for breast tenderness during pregnancy. This may help if worn during sleep. Pads or tissues may be used in the bra if you are leaking colostrum.  Do not use hot tubs, steam rooms or saunas throughout the pregnancy.  Wear your seat belt at all times when driving. This protects you and your baby if you are in an accident.  Avoid raw meat, uncooked cheese, cat litter boxes, and soil used by cats. These carry germs that can cause birth defects in the baby.  The second trimester is also a good time to visit your dentist for your dental health if this has not been done yet. Getting your teeth cleaned is okay. Use a soft toothbrush. Brush gently during pregnancy.  It is easier to leak urine during pregnancy. Tightening up and strengthening the pelvic muscles will help with this problem. Practice stopping your urination while you are going to the  bathroom. These are the same muscles you need to strengthen. It is also the muscles you would use as if you were trying to stop from passing gas. You can practice tightening these muscles up 10 times a set and repeating this about 3 times per day. Once you know what muscles to tighten up, do not perform these exercises during urination. It is more likely to contribute to an infection by backing up the urine.  Ask for help if you have financial, counseling, or nutritional needs during pregnancy. Your caregiver will be able to offer counseling for these needs as well as refer you for other special needs.  Your skin may become oily. If so, wash your face with mild soap, use non-greasy moisturizer and oil or cream based makeup. MEDICINES AND DRUG USE IN PREGNANCY  Take prenatal vitamins as directed. The vitamin should contain 1 milligram of folic acid. Keep all vitamins out of reach of children. Only a couple vitamins or tablets containing iron may be fatal to a baby or young child when ingested.  Avoid use of all medicines, including herbs, over-the-counter medicines, not prescribed or suggested by your caregiver. Only take over-the-counter or prescription medicines for pain, discomfort, or fever as directed by your caregiver. Do not use aspirin.  Let your caregiver also know about herbs you may be using.  Alcohol is related to a number of birth defects. This includes fetal alcohol syndrome. All alcohol, in any form, should be avoided completely. Smoking will cause low birth rate and premature babies.  Street or illegal drugs are very harmful to the baby. They are absolutely forbidden. A baby born to an addicted mother will be addicted at birth. The baby will go through the same withdrawal an adult does. SEEK MEDICAL CARE IF:  You have any concerns or worries during your pregnancy. It is better to call with your questions if you feel they cannot wait, rather than worry about them. SEEK IMMEDIATE  MEDICAL CARE IF:   An unexplained oral temperature above 102 F (38.9 C) develops, or as your caregiver suggests.  You have leaking of fluid from the vagina (birth canal). If leaking membranes are suspected, take your temperature and tell your caregiver of this when you call.  There is vaginal spotting, bleeding, or passing clots. Tell your caregiver of the amount and how many pads are used. Light spotting in pregnancy is common, especially following intercourse.  You develop a bad smelling vaginal discharge with a change in the color from clear to white.  You continue to feel   sick to your stomach (nauseated) and have no relief from remedies suggested. You vomit blood or coffee ground-like materials.  You lose more than 2 pounds of weight or gain more than 2 pounds of weight over 1 week, or as suggested by your caregiver.  You notice swelling of your face, hands, feet, or legs.  You get exposed to German measles and have never had them.  You are exposed to fifth disease or chickenpox.  You develop belly (abdominal) pain. Round ligament discomfort is a common non-cancerous (benign) cause of abdominal pain in pregnancy. Your caregiver still must evaluate you.  You develop a bad headache that does not go away.  You develop fever, diarrhea, pain with urination, or shortness of breath.  You develop visual problems, blurry, or double vision.  You fall or are in a car accident or any kind of trauma.  There is mental or physical violence at home. Document Released: 01/28/2001 Document Revised: 10/29/2011 Document Reviewed: 08/02/2008 [employer] Patient Information 2014 [employer].  

## 2012-11-02 ENCOUNTER — Other Ambulatory Visit: Payer: Self-pay | Admitting: Women's Health

## 2012-11-02 ENCOUNTER — Other Ambulatory Visit: Payer: Medicaid Other

## 2012-11-02 ENCOUNTER — Ambulatory Visit (INDEPENDENT_AMBULATORY_CARE_PROVIDER_SITE_OTHER): Payer: Medicaid Other | Admitting: Obstetrics & Gynecology

## 2012-11-02 ENCOUNTER — Ambulatory Visit (INDEPENDENT_AMBULATORY_CARE_PROVIDER_SITE_OTHER): Payer: Medicaid Other

## 2012-11-02 ENCOUNTER — Other Ambulatory Visit: Payer: Self-pay | Admitting: Obstetrics & Gynecology

## 2012-11-02 VITALS — BP 150/68 | Wt 246.0 lb

## 2012-11-02 DIAGNOSIS — E669 Obesity, unspecified: Secondary | ICD-10-CM

## 2012-11-02 DIAGNOSIS — O162 Unspecified maternal hypertension, second trimester: Secondary | ICD-10-CM

## 2012-11-02 DIAGNOSIS — O10019 Pre-existing essential hypertension complicating pregnancy, unspecified trimester: Secondary | ICD-10-CM

## 2012-11-02 DIAGNOSIS — O169 Unspecified maternal hypertension, unspecified trimester: Secondary | ICD-10-CM

## 2012-11-02 DIAGNOSIS — O10912 Unspecified pre-existing hypertension complicating pregnancy, second trimester: Secondary | ICD-10-CM

## 2012-11-02 DIAGNOSIS — Z1389 Encounter for screening for other disorder: Secondary | ICD-10-CM

## 2012-11-02 DIAGNOSIS — Z331 Pregnant state, incidental: Secondary | ICD-10-CM

## 2012-11-02 DIAGNOSIS — Z3400 Encounter for supervision of normal first pregnancy, unspecified trimester: Secondary | ICD-10-CM

## 2012-11-02 DIAGNOSIS — O99810 Abnormal glucose complicating pregnancy: Secondary | ICD-10-CM

## 2012-11-02 LAB — POCT URINALYSIS DIPSTICK
Leukocytes, UA: NEGATIVE
Nitrite, UA: NEGATIVE

## 2012-11-02 LAB — CBC
Hemoglobin: 11.9 g/dL — ABNORMAL LOW (ref 12.0–15.0)
MCH: 31.1 pg (ref 26.0–34.0)
MCHC: 34.4 g/dL (ref 30.0–36.0)
RDW: 14.4 % (ref 11.5–15.5)

## 2012-11-02 NOTE — Progress Notes (Signed)
U/S(28+0wks)-vtx active fetus, approp growth EFW 2 lb 10oz (55th%tile), fluid wnl AFI-12.5cm, ant gr 1 plac, UA Doppler RI-0.65 & 0.66, female fetus "Kyrie"

## 2012-11-02 NOTE — Progress Notes (Signed)
BP weight and urine results all reviewed and noted. Patient reports good fetal movement, denies any bleeding and no rupture of membranes symptoms or regular contractions. Patient is without complaints. All questions were answered.  

## 2012-11-02 NOTE — Progress Notes (Signed)
No complaints at this time. Pt unable to obtain urine sample. PN 2 today.

## 2012-11-02 NOTE — Patient Instructions (Addendum)
Breastfeeding A change in hormones during your pregnancy causes growth of your breast tissue and an increase in number and size of milk ducts. The hormone prolactin allows proteins, sugars, and fats from your blood supply to make breast milk in your milk-producing glands. The hormone progesterone prevents breast milk from being released before the birth of your baby. After the birth of your baby, your progesterone level decreases allowing breast milk to be released. Thoughts of your baby, as well as his or her sucking or crying, can stimulate the release of milk from the milk-producing glands. Deciding to breastfeed (nurse) is one of the best choices you can make for you and your baby. The information that follows gives a brief review of the benefits, as well as other important skills to know about breastfeeding. BENEFITS OF BREASTFEEDING For your baby  The first milk (colostrum) helps your baby's digestive system function better.   There are antibodies in your milk that help your baby fight off infections.   Your baby has a lower incidence of asthma, allergies, and sudden infant death syndrome (SIDS).   The nutrients in breast milk are better for your baby than infant formulas.  Breast milk improves your baby's brain development.   Your baby will have less gas, colic, and constipation.  Your baby is less likely to develop other conditions, such as childhood obesity, asthma, or diabetes mellitus. For you  Breastfeeding helps develop a very special bond between you and your baby.   Breastfeeding is convenient, always available at the correct temperature, and costs nothing.   Breastfeeding helps to burn calories and helps you lose the weight gained during pregnancy.   Breastfeeding makes your uterus contract back down to normal size faster and slows bleeding following delivery.   Breastfeeding mothers have a lower risk of developing osteoporosis or breast or ovarian cancer later  in life.  BREASTFEEDING FREQUENCY  A healthy, full-term baby may breastfeed as often as every hour or space his or her feedings to every 3 hours. Breastfeeding frequency will vary from baby to baby.   Newborns should be fed no less than every 2 3 hours during the day and every 4 5 hours during the night. You should breastfeed a minimum of 8 feedings in a 24 hour period.  Awaken your baby to breastfeed if it has been 3 4 hours since the last feeding.  Breastfeed when you feel the need to reduce the fullness of your breasts or when your newborn shows signs of hunger. Signs that your baby may be hungry include:  Increased alertness or activity.  Stretching.  Movement of the head from side to side.  Movement of the head and opening of the mouth when the corner of the mouth or cheek is stroked (rooting).  Increased sucking sounds, smacking lips, cooing, sighing, or squeaking.  Hand-to-mouth movements.  Increased sucking of fingers or hands.  Fussing.  Intermittent crying.  Signs of extreme hunger will require calming and consoling before you try to feed your baby. Signs of extreme hunger may include:  Restlessness.  A loud, strong cry.  Screaming.  Frequent feeding will help you make more milk and will help prevent problems, such as sore nipples and engorgement of the breasts.  BREASTFEEDING   Whether lying down or sitting, be sure that the baby's abdomen is facing your abdomen.   Support your breast with 4 fingers under your breast and your thumb above your nipple. Make sure your fingers are well away from   your nipple and your baby's mouth.   Stroke your baby's lips gently with your finger or nipple.   When your baby's mouth is open wide enough, place all of your nipple and as much of the colored area around your nipple (areola) as possible into your baby's mouth.  More areola should be visible above his or her upper lip than below his or her lower lip.  Your  baby's tongue should be between his or her lower gum and your breast.  Ensure that your baby's mouth is correctly positioned around the nipple (latched). Your baby's lips should create a seal on your breast.  Signs that your baby has effectively latched onto your nipple include:  Tugging or sucking without pain.  Swallowing heard between sucks.  Absent click or smacking sound.  Muscle movement above and in front of his or her ears with sucking.  Your baby must suck about 2 3 minutes in order to get your milk. Allow your baby to feed on each breast as long as he or she wants. Nurse your baby until he or she unlatches or falls asleep at the first breast, then offer the second breast.  Signs that your baby is full and satisfied include:  A gradual decrease in the number of sucks or complete cessation of sucking.  Falling asleep.  Extension or relaxation of his or her body.  Retention of a small amount of milk in his or her mouth.  Letting go of your breast by himself or herself.  Signs of effective breastfeeding in you include:  Breasts that have increased firmness, weight, and size prior to feeding.  Breasts that are softer after nursing.  Increased milk volume, as well as a change in milk consistency and color by the 5th day of breastfeeding.  Breast fullness relieved by breastfeeding.  Nipples are not sore, cracked, or bleeding.  If needed, break the suction by putting your finger into the corner of your baby's mouth and sliding your finger between his or her gums. Then, remove your breast from his or her mouth.  It is common for babies to spit up a small amount after a feeding.  Babies often swallow air during feeding. This can make babies fussy. Burping your baby between breasts can help with this.  Vitamin D supplements are recommended for babies who get only breast milk.  Avoid using a pacifier during your baby's first 4 6 weeks.  Avoid supplemental feedings of  water, formula, or juice in place of breastfeeding. Breast milk is all the food your baby needs. It is not necessary for your baby to have water or formula. Your breasts will make more milk if supplemental feedings are avoided during the early weeks. HOW TO TELL WHETHER YOUR BABY IS GETTING ENOUGH BREAST MILK Wondering whether or not your baby is getting enough milk is a common concern among mothers. You can be assured that your baby is getting enough milk if:   Your baby is actively sucking and you hear swallowing.   Your baby seems relaxed and satisfied after a feeding.   Your baby nurses at least 8 12 times in a 24 hour time period.  During the first 3 5 days of age:  Your baby is wetting at least 3 5 diapers in a 24 hour period. The urine should be clear and pale yellow.  Your baby is having at least 3 4 stools in a 24 hour period. The stool should be soft and yellow.  At   5 7 days of age, your baby is having at least 3 6 stools in a 24 hour period. The stool should be seedy and yellow by 5 days of age.  Your baby has a weight loss less than 7 10% during the first 3 days of age.  Your baby does not lose weight after 3 7 days of age.  Your baby gains 4 7 ounces each week after he or she is 4 days of age.  Your baby gains weight by 5 days of age and is back to birth weight within 2 weeks. ENGORGEMENT In the first week after your baby is born, you may experience extremely full breasts (engorgement). When engorged, your breasts may feel heavy, warm, or tender to the touch. Engorgement peaks within 24 48 hours after delivery of your baby.  Engorgement may be reduced by:  Continuing to breastfeed.  Increasing the frequency of breastfeeding.  Taking warm showers or applying warm, moist heat to your breasts just before each feeding. This increases circulation and helps the milk flow.   Gently massaging your breast before and during the feedings. With your fingertips, massage from  your chest wall towards your nipple in a circular motion.   Ensuring that your baby empties at least one breast at every feeding. It also helps to start the next feeding on the opposite breast.   Expressing breast milk by hand or by using a breast pump to empty the breasts if your baby is sleepy, or not nursing well. You may also want to express milk if you are returning to work oryou feel you are getting engorged.  Ensuring your baby is latched on and positioned properly while breastfeeding. If you follow these suggestions, your engorgement should improve in 24 48 hours. If you are still experiencing difficulty, call your lactation consultant or caregiver.  CARING FOR YOURSELF Take care of your breasts.  Bathe or shower daily.   Avoid using soap on your nipples.   Wear a supportive bra. Avoid wearing underwire style bras.  Air dry your nipples for a 3 4minutes after each feeding.   Use only cotton bra pads to absorb breast milk leakage. Leaking of breast milk between feedings is normal.   Use only pure lanolin on your nipples after nursing. You do not need to wash it off before feeding your baby again. Another option is to express a few drops of breast milk and gently massage that milk into your nipples.  Continue breast self-awareness checks. Take care of yourself.  Eat healthy foods. Alternate 3 meals with 3 snacks.  Avoid foods that you notice affect your baby in a bad way.  Drink milk, fruit juice, and water to satisfy your thirst (about 8 glasses a day).   Rest often, relax, and take your prenatal vitamins to prevent fatigue, stress, and anemia.  Avoid chewing and smoking tobacco.  Avoid alcohol and drug use.  Take over-the-counter and prescribed medicine only as directed by your caregiver or pharmacist. You should always check with your caregiver or pharmacist before taking any new medicine, vitamin, or herbal supplement.  Know that pregnancy is possible while  breastfeeding. If desired, talk to your caregiver about family planning and safe birth control methods that may be used while breastfeeding. SEEK MEDICAL CARE IF:   You feel like you want to stop breastfeeding or have become frustrated with breastfeeding.  You have painful breasts or nipples.  Your nipples are cracked or bleeding.  Your breasts are red, tender,   or warm.  You have a swollen area on either breast.  You have a fever or chills.  You have nausea or vomiting.  You have drainage from your nipples.  Your breasts do not become full before feedings by the 5th day after delivery.  You feel sad and depressed.  Your baby is too sleepy to eat well.  Your baby is having trouble sleeping.   Your baby is wetting less than 3 diapers in a 24 hour period.  Your baby has less than 3 stools in a 24 hour period.  Your baby's skin or the white part of his or her eyes becomes more yellow.   Your baby is not gaining weight by 5 days of age. MAKE SURE YOU:   Understand these instructions.  Will watch your condition.  Will get help right away if you are not doing well or get worse. Document Released: 02/03/2005 Document Revised: 10/29/2011 Document Reviewed: 09/10/2011 [employer] Patient Information 2014 [employer].  

## 2012-11-03 LAB — HIV ANTIBODY (ROUTINE TESTING W REFLEX): HIV: NONREACTIVE

## 2012-11-03 LAB — GLUCOSE TOLERANCE, 2 HOURS W/ 1HR: Glucose, 2 hour: 135 mg/dL (ref 70–139)

## 2012-11-03 LAB — RPR

## 2012-11-12 NOTE — Addendum Note (Signed)
Addended by: Lazaro Arms on: 11/12/2012 10:28 AM   Modules accepted: Orders

## 2012-11-23 ENCOUNTER — Encounter: Payer: Self-pay | Admitting: Obstetrics & Gynecology

## 2012-11-23 ENCOUNTER — Ambulatory Visit (INDEPENDENT_AMBULATORY_CARE_PROVIDER_SITE_OTHER): Payer: Medicaid Other | Admitting: Obstetrics & Gynecology

## 2012-11-23 VITALS — BP 152/80 | Wt 252.0 lb

## 2012-11-23 DIAGNOSIS — O10013 Pre-existing essential hypertension complicating pregnancy, third trimester: Secondary | ICD-10-CM

## 2012-11-23 DIAGNOSIS — Z331 Pregnant state, incidental: Secondary | ICD-10-CM

## 2012-11-23 DIAGNOSIS — O10019 Pre-existing essential hypertension complicating pregnancy, unspecified trimester: Secondary | ICD-10-CM

## 2012-11-23 DIAGNOSIS — Z1389 Encounter for screening for other disorder: Secondary | ICD-10-CM

## 2012-11-23 DIAGNOSIS — E669 Obesity, unspecified: Secondary | ICD-10-CM

## 2012-11-23 LAB — POCT URINALYSIS DIPSTICK
Glucose, UA: NEGATIVE
Ketones, UA: NEGATIVE
Protein, UA: NEGATIVE

## 2012-11-23 NOTE — Progress Notes (Signed)
Patient doing well.  Complains of sore throat.  No vaginal bleeding, discharge, or loss of fluid.  Patient reports good fetal movement. Patient states that she has an appointment with a Dietician tomorrow (since she failed her 2 hour GTT).   BP elevated again today, but no proteinuria. BP's reviewed today - patient likely has chronic HTN.  Given HTN will schedule twice weekly NST's.   Patient to follow up with dietician.  All questions answer.  F/U in 2 weeks for NST.

## 2012-11-23 NOTE — Progress Notes (Signed)
Agree with below, no meds for now but may need in future.  Twice weekly testing NST alt with sonogram

## 2012-12-01 ENCOUNTER — Ambulatory Visit (INDEPENDENT_AMBULATORY_CARE_PROVIDER_SITE_OTHER): Payer: Medicaid Other | Admitting: Obstetrics & Gynecology

## 2012-12-01 ENCOUNTER — Ambulatory Visit (INDEPENDENT_AMBULATORY_CARE_PROVIDER_SITE_OTHER): Payer: Medicaid Other

## 2012-12-01 ENCOUNTER — Encounter: Payer: Self-pay | Admitting: Obstetrics & Gynecology

## 2012-12-01 ENCOUNTER — Other Ambulatory Visit: Payer: Self-pay | Admitting: Obstetrics & Gynecology

## 2012-12-01 VITALS — BP 148/70 | Wt 252.0 lb

## 2012-12-01 DIAGNOSIS — O10013 Pre-existing essential hypertension complicating pregnancy, third trimester: Secondary | ICD-10-CM

## 2012-12-01 DIAGNOSIS — O10019 Pre-existing essential hypertension complicating pregnancy, unspecified trimester: Secondary | ICD-10-CM

## 2012-12-01 DIAGNOSIS — O99210 Obesity complicating pregnancy, unspecified trimester: Secondary | ICD-10-CM

## 2012-12-01 DIAGNOSIS — Z331 Pregnant state, incidental: Secondary | ICD-10-CM

## 2012-12-01 DIAGNOSIS — E669 Obesity, unspecified: Secondary | ICD-10-CM

## 2012-12-01 DIAGNOSIS — Z1389 Encounter for screening for other disorder: Secondary | ICD-10-CM

## 2012-12-01 DIAGNOSIS — O99019 Anemia complicating pregnancy, unspecified trimester: Secondary | ICD-10-CM

## 2012-12-01 LAB — POCT URINALYSIS DIPSTICK
Blood, UA: NEGATIVE
Ketones, UA: NEGATIVE

## 2012-12-01 MED ORDER — METHYLDOPA 500 MG PO TABS: 500.0000 mg | ORAL_TABLET | Freq: Three times a day (TID) | ORAL | Status: DC

## 2012-12-01 NOTE — Progress Notes (Signed)
BP stable but remains a bit elevated, I am going to go ahead and use aldomet 500 mg BID Sonogram excellent see report BP weight and urine results all reviewed and noted. Patient reports good fetal movement, denies any bleeding and no rupture of membranes symptoms or regular contractions. Patient is without complaints. All questions were answered.

## 2012-12-01 NOTE — Progress Notes (Signed)
U/S(32+1wks)-vtx active fetus, BPP 8/8, fluid wnl AFI-8cm, ant gr 1 plac, UA Doppler RI-0.70 & 0.60, EFW 4 lb 5 oz (49th%tile), female fetus "Donna Moore"

## 2012-12-01 NOTE — Progress Notes (Deleted)
FOLLOW-UP U/S. 

## 2012-12-02 ENCOUNTER — Telehealth: Payer: Self-pay | Admitting: Obstetrics & Gynecology

## 2012-12-02 NOTE — Telephone Encounter (Signed)
Spoke with pt. Dr. Despina Hidden put her on Aldomet BID but the bottle says TID. Pt confused on how to take it. I spoke with Dr. Despina Hidden and it's supposed to be BID. Pt aware. JSY

## 2012-12-06 ENCOUNTER — Encounter: Payer: Self-pay | Admitting: Obstetrics & Gynecology

## 2012-12-06 ENCOUNTER — Ambulatory Visit (INDEPENDENT_AMBULATORY_CARE_PROVIDER_SITE_OTHER): Payer: Medicaid Other | Admitting: Obstetrics & Gynecology

## 2012-12-06 VITALS — BP 140/60 | Wt 256.0 lb

## 2012-12-06 DIAGNOSIS — O99210 Obesity complicating pregnancy, unspecified trimester: Secondary | ICD-10-CM

## 2012-12-06 DIAGNOSIS — Z331 Pregnant state, incidental: Secondary | ICD-10-CM

## 2012-12-06 DIAGNOSIS — O99019 Anemia complicating pregnancy, unspecified trimester: Secondary | ICD-10-CM

## 2012-12-06 DIAGNOSIS — O10913 Unspecified pre-existing hypertension complicating pregnancy, third trimester: Secondary | ICD-10-CM

## 2012-12-06 DIAGNOSIS — O10019 Pre-existing essential hypertension complicating pregnancy, unspecified trimester: Secondary | ICD-10-CM

## 2012-12-06 DIAGNOSIS — Z1389 Encounter for screening for other disorder: Secondary | ICD-10-CM

## 2012-12-06 DIAGNOSIS — E119 Type 2 diabetes mellitus without complications: Secondary | ICD-10-CM

## 2012-12-06 DIAGNOSIS — E669 Obesity, unspecified: Secondary | ICD-10-CM

## 2012-12-06 LAB — POCT URINALYSIS DIPSTICK
Glucose, UA: NEGATIVE
Ketones, UA: NEGATIVE
Protein, UA: NEGATIVE

## 2012-12-06 NOTE — Progress Notes (Signed)
FOR NST. 

## 2012-12-06 NOTE — Progress Notes (Signed)
Reactive NST.  Doing ok on the aldomet 500 bid less dizziness than in first few days BP weight and urine results all reviewed and noted. Patient reports good fetal movement, denies any bleeding and no rupture of membranes symptoms or regular contractions. Patient is without complaints. All questions were answered.

## 2012-12-08 ENCOUNTER — Encounter: Payer: Medicaid Other | Attending: Obstetrics & Gynecology

## 2012-12-08 VITALS — Ht 67.0 in | Wt 255.1 lb

## 2012-12-08 DIAGNOSIS — Z713 Dietary counseling and surveillance: Secondary | ICD-10-CM | POA: Insufficient documentation

## 2012-12-08 DIAGNOSIS — O99810 Abnormal glucose complicating pregnancy: Secondary | ICD-10-CM | POA: Insufficient documentation

## 2012-12-09 ENCOUNTER — Ambulatory Visit (INDEPENDENT_AMBULATORY_CARE_PROVIDER_SITE_OTHER): Payer: Medicaid Other | Admitting: Obstetrics & Gynecology

## 2012-12-09 ENCOUNTER — Other Ambulatory Visit: Payer: Self-pay | Admitting: Obstetrics & Gynecology

## 2012-12-09 ENCOUNTER — Encounter: Payer: Self-pay | Admitting: Obstetrics & Gynecology

## 2012-12-09 ENCOUNTER — Ambulatory Visit (INDEPENDENT_AMBULATORY_CARE_PROVIDER_SITE_OTHER): Payer: Medicaid Other

## 2012-12-09 VITALS — BP 122/70 | Wt 255.0 lb

## 2012-12-09 DIAGNOSIS — O10013 Pre-existing essential hypertension complicating pregnancy, third trimester: Secondary | ICD-10-CM

## 2012-12-09 DIAGNOSIS — O10913 Unspecified pre-existing hypertension complicating pregnancy, third trimester: Secondary | ICD-10-CM

## 2012-12-09 DIAGNOSIS — E669 Obesity, unspecified: Secondary | ICD-10-CM

## 2012-12-09 DIAGNOSIS — Z331 Pregnant state, incidental: Secondary | ICD-10-CM

## 2012-12-09 DIAGNOSIS — O10019 Pre-existing essential hypertension complicating pregnancy, unspecified trimester: Secondary | ICD-10-CM

## 2012-12-09 DIAGNOSIS — O99019 Anemia complicating pregnancy, unspecified trimester: Secondary | ICD-10-CM

## 2012-12-09 DIAGNOSIS — Z1389 Encounter for screening for other disorder: Secondary | ICD-10-CM

## 2012-12-09 LAB — POCT URINALYSIS DIPSTICK
Blood, UA: NEGATIVE
Glucose, UA: NEGATIVE
Leukocytes, UA: NEGATIVE
Nitrite, UA: NEGATIVE

## 2012-12-09 NOTE — Addendum Note (Signed)
Addended by: Kelly Chiquito on: 12/09/2012 12:02 PM   Modules accepted: Orders

## 2012-12-09 NOTE — Progress Notes (Signed)
  Patient was seen on 12/08/12 for Gestational Diabetes self-management class at the [facility]. The following learning objectives were met by the patient during this course:   States the definition of Gestational Diabetes  States why dietary management is important in controlling blood glucose  Describes the effects of carbohydrates on blood glucose levels  Demonstrates ability to create a balanced meal plan  Demonstrates carbohydrate counting   States when to check blood glucose levels  Demonstrates proper blood glucose monitoring techniques  States the effect of stress and exercise on blood glucose levels  States the importance of limiting caffeine and abstaining from alcohol and smoking  Plan:  Aim for 2 Carb Choices per meal (30 grams) +/- 1 either way for breakfast Aim for 3 Carb Choices per meal (45 grams) +/- 1 either way from lunch and dinner Aim for 1-2 Carbs per snack Begin reading food labels for Total Carbohydrate and sugar grams of foods Consider  increasing your activity level by walking daily as tolerated Begin checking BG before breakfast and 1-2 hours after first bit of breakfast, lunch and dinner after  as directed by MD  Take medication  as directed by MD  Blood glucose monitor given: Accu Chek Nano BG Monitoring Kit Lot # [id number] Exp: 01/16/14 Blood glucose reading: 88  Patient instructed to monitor glucose levels: FBS: 60 - <90 1 hour: <140 2 hour: <120  Patient received the following handouts:  Nutrition Diabetes and Pregnancy  Carbohydrate Counting List  Meal Planning worksheet  Patient will be seen for follow-up as needed.

## 2012-12-09 NOTE — Progress Notes (Signed)
Sonogram report done and reviewed BPP good Dopplers excellent fluid good BP weight and urine results all reviewed and noted. Patient reports good fetal movement, denies any bleeding and no rupture of membranes symptoms or regular contractions. Patient is without complaints. All questions were answered.

## 2012-12-09 NOTE — Progress Notes (Signed)
U/S(33+2wks)-vtx active fetus BPP 8/8, fluid wnl AFI-10.0cm, anterior gr 1 plac, UA Doppler RI-0.57 & 0.53, female fetus "Kyrie"

## 2012-12-09 NOTE — Progress Notes (Signed)
For Up U/S. 

## 2012-12-13 ENCOUNTER — Encounter: Payer: Self-pay | Admitting: Obstetrics & Gynecology

## 2012-12-13 ENCOUNTER — Ambulatory Visit (INDEPENDENT_AMBULATORY_CARE_PROVIDER_SITE_OTHER): Payer: Medicaid Other | Admitting: Obstetrics & Gynecology

## 2012-12-13 VITALS — BP 140/70 | Wt 255.0 lb

## 2012-12-13 DIAGNOSIS — O10019 Pre-existing essential hypertension complicating pregnancy, unspecified trimester: Secondary | ICD-10-CM

## 2012-12-13 DIAGNOSIS — O99019 Anemia complicating pregnancy, unspecified trimester: Secondary | ICD-10-CM

## 2012-12-13 DIAGNOSIS — Z331 Pregnant state, incidental: Secondary | ICD-10-CM

## 2012-12-13 DIAGNOSIS — O10913 Unspecified pre-existing hypertension complicating pregnancy, third trimester: Secondary | ICD-10-CM

## 2012-12-13 DIAGNOSIS — Z1389 Encounter for screening for other disorder: Secondary | ICD-10-CM

## 2012-12-13 DIAGNOSIS — E669 Obesity, unspecified: Secondary | ICD-10-CM

## 2012-12-13 LAB — POCT URINALYSIS DIPSTICK
Glucose, UA: NEGATIVE
Ketones, UA: NEGATIVE
Leukocytes, UA: NEGATIVE
Nitrite, UA: NEGATIVE

## 2012-12-13 NOTE — Progress Notes (Signed)
Reactive NST BP weight and urine results all reviewed and noted. Patient reports good fetal movement, denies any bleeding and no rupture of membranes symptoms or regular contractions. Patient is without complaints. All questions were answered.  

## 2012-12-13 NOTE — Progress Notes (Signed)
For NST

## 2012-12-17 ENCOUNTER — Other Ambulatory Visit: Payer: Self-pay | Admitting: Obstetrics & Gynecology

## 2012-12-17 ENCOUNTER — Ambulatory Visit (INDEPENDENT_AMBULATORY_CARE_PROVIDER_SITE_OTHER): Payer: Medicaid Other | Admitting: Obstetrics & Gynecology

## 2012-12-17 ENCOUNTER — Encounter: Payer: Self-pay | Admitting: Obstetrics & Gynecology

## 2012-12-17 ENCOUNTER — Ambulatory Visit (INDEPENDENT_AMBULATORY_CARE_PROVIDER_SITE_OTHER): Payer: Medicaid Other

## 2012-12-17 VITALS — BP 130/80 | Wt 256.0 lb

## 2012-12-17 DIAGNOSIS — O10013 Pre-existing essential hypertension complicating pregnancy, third trimester: Secondary | ICD-10-CM

## 2012-12-17 DIAGNOSIS — Z1389 Encounter for screening for other disorder: Secondary | ICD-10-CM

## 2012-12-17 DIAGNOSIS — O10913 Unspecified pre-existing hypertension complicating pregnancy, third trimester: Secondary | ICD-10-CM

## 2012-12-17 DIAGNOSIS — O10019 Pre-existing essential hypertension complicating pregnancy, unspecified trimester: Secondary | ICD-10-CM

## 2012-12-17 DIAGNOSIS — O99019 Anemia complicating pregnancy, unspecified trimester: Secondary | ICD-10-CM

## 2012-12-17 DIAGNOSIS — O163 Unspecified maternal hypertension, third trimester: Secondary | ICD-10-CM

## 2012-12-17 DIAGNOSIS — O99810 Abnormal glucose complicating pregnancy: Secondary | ICD-10-CM

## 2012-12-17 DIAGNOSIS — O169 Unspecified maternal hypertension, unspecified trimester: Secondary | ICD-10-CM

## 2012-12-17 DIAGNOSIS — E669 Obesity, unspecified: Secondary | ICD-10-CM

## 2012-12-17 DIAGNOSIS — Z331 Pregnant state, incidental: Secondary | ICD-10-CM

## 2012-12-17 LAB — POCT URINALYSIS DIPSTICK
Glucose, UA: NEGATIVE
Nitrite, UA: NEGATIVE

## 2012-12-17 NOTE — Progress Notes (Signed)
Sonogram reviewed and report done.  All reassuring and good BP weight and urine results all reviewed and noted. Patient reports good fetal movement, denies any bleeding and no rupture of membranes symptoms or regular contractions. Patient is without complaints. All questions were answered.

## 2012-12-17 NOTE — Progress Notes (Signed)
U/S(34+3wks)-vtx active fetus, BPP 8/8, fluid wnl AFI- 13.0 cm, anterior Gr 1 plac, EFW 5 lb 11 oz (56th%tile ), UA Doppler RI- 0.52 & 0.51, female fetus "Donna Moore", FHR-137 bpm

## 2012-12-17 NOTE — Addendum Note (Signed)
Addended by: Criss Alvine on: 12/17/2012 12:02 PM   Modules accepted: Orders

## 2012-12-20 ENCOUNTER — Ambulatory Visit (INDEPENDENT_AMBULATORY_CARE_PROVIDER_SITE_OTHER): Payer: Medicaid Other | Admitting: Obstetrics & Gynecology

## 2012-12-20 ENCOUNTER — Encounter: Payer: Self-pay | Admitting: Obstetrics & Gynecology

## 2012-12-20 VITALS — BP 130/80 | Wt 255.0 lb

## 2012-12-20 DIAGNOSIS — O99019 Anemia complicating pregnancy, unspecified trimester: Secondary | ICD-10-CM

## 2012-12-20 DIAGNOSIS — O10019 Pre-existing essential hypertension complicating pregnancy, unspecified trimester: Secondary | ICD-10-CM

## 2012-12-20 DIAGNOSIS — Z1389 Encounter for screening for other disorder: Secondary | ICD-10-CM

## 2012-12-20 DIAGNOSIS — Z331 Pregnant state, incidental: Secondary | ICD-10-CM

## 2012-12-20 DIAGNOSIS — O24913 Unspecified diabetes mellitus in pregnancy, third trimester: Secondary | ICD-10-CM

## 2012-12-20 DIAGNOSIS — O10913 Unspecified pre-existing hypertension complicating pregnancy, third trimester: Secondary | ICD-10-CM

## 2012-12-20 LAB — POCT URINALYSIS DIPSTICK
Glucose, UA: NEGATIVE
Leukocytes, UA: NEGATIVE
Nitrite, UA: NEGATIVE

## 2012-12-20 NOTE — Progress Notes (Signed)
Reactive NST BP weight and urine results all reviewed and noted. Patient reports good fetal movement, denies any bleeding and no rupture of membranes symptoms or regular contractions. Patient is without complaints. All questions were answered. Continue twice weekly testing schedule

## 2012-12-22 ENCOUNTER — Encounter: Payer: Self-pay | Admitting: Obstetrics and Gynecology

## 2012-12-22 ENCOUNTER — Telehealth: Payer: Self-pay | Admitting: Obstetrics and Gynecology

## 2012-12-22 ENCOUNTER — Ambulatory Visit (INDEPENDENT_AMBULATORY_CARE_PROVIDER_SITE_OTHER): Payer: Medicaid Other | Admitting: Obstetrics and Gynecology

## 2012-12-22 VITALS — BP 140/80 | Wt 256.0 lb

## 2012-12-22 DIAGNOSIS — Z331 Pregnant state, incidental: Secondary | ICD-10-CM

## 2012-12-22 DIAGNOSIS — N764 Abscess of vulva: Secondary | ICD-10-CM

## 2012-12-22 MED ORDER — SULFAMETHOXAZOLE-TMP DS 800-160 MG PO TABS: 1.0000 | ORAL_TABLET | Freq: Two times a day (BID) | ORAL | Status: DC

## 2012-12-22 MED ORDER — HYDROCODONE-ACETAMINOPHEN 5-325 MG PO TABS: 1.0000 | ORAL_TABLET | Freq: Four times a day (QID) | ORAL | Status: DC | PRN

## 2012-12-22 NOTE — Progress Notes (Signed)
Pt unable to void. Pt thinks she may have a bartholin's cyst. Pt states that it is very painful and swollen.

## 2012-12-22 NOTE — Telephone Encounter (Signed)
Spoke with pt. Has a Bartholin's cyst. First noticed it last week, but has gotten worse, very painful. Using warm compresses. Call transferred to front desk for appt today. JSY

## 2012-12-22 NOTE — Progress Notes (Signed)
Workin: vulvar abscess: I& D of right labia majora abscess, not a bartholins, with 1.5 cm cavity expressing sebaceous material and purulence. Surface epithelium excised 6mm. Local anesthesia. Rx Septra DS bid x 10d. U/s for EFW/growth in a.m as scheduled

## 2012-12-23 ENCOUNTER — Ambulatory Visit (INDEPENDENT_AMBULATORY_CARE_PROVIDER_SITE_OTHER): Payer: Medicaid Other | Admitting: Obstetrics and Gynecology

## 2012-12-23 ENCOUNTER — Ambulatory Visit (INDEPENDENT_AMBULATORY_CARE_PROVIDER_SITE_OTHER): Payer: Medicaid Other

## 2012-12-23 ENCOUNTER — Encounter: Payer: Medicaid Other | Admitting: Obstetrics and Gynecology

## 2012-12-23 ENCOUNTER — Other Ambulatory Visit: Payer: Self-pay

## 2012-12-23 ENCOUNTER — Other Ambulatory Visit: Payer: Self-pay | Admitting: Obstetrics & Gynecology

## 2012-12-23 VITALS — BP 138/60 | Wt 256.6 lb

## 2012-12-23 DIAGNOSIS — O24913 Unspecified diabetes mellitus in pregnancy, third trimester: Secondary | ICD-10-CM

## 2012-12-23 DIAGNOSIS — O99810 Abnormal glucose complicating pregnancy: Secondary | ICD-10-CM

## 2012-12-23 DIAGNOSIS — O10019 Pre-existing essential hypertension complicating pregnancy, unspecified trimester: Secondary | ICD-10-CM

## 2012-12-23 DIAGNOSIS — O10913 Unspecified pre-existing hypertension complicating pregnancy, third trimester: Secondary | ICD-10-CM

## 2012-12-23 DIAGNOSIS — E669 Obesity, unspecified: Secondary | ICD-10-CM

## 2012-12-23 DIAGNOSIS — Z331 Pregnant state, incidental: Secondary | ICD-10-CM

## 2012-12-23 DIAGNOSIS — O10013 Pre-existing essential hypertension complicating pregnancy, third trimester: Secondary | ICD-10-CM

## 2012-12-23 DIAGNOSIS — O0993 Supervision of high risk pregnancy, unspecified, third trimester: Secondary | ICD-10-CM

## 2012-12-23 DIAGNOSIS — Z1389 Encounter for screening for other disorder: Secondary | ICD-10-CM

## 2012-12-23 LAB — POCT URINALYSIS DIPSTICK
Blood, UA: 2
Glucose, UA: NEGATIVE
Ketones, UA: NEGATIVE
Nitrite, UA: NEGATIVE

## 2012-12-23 NOTE — Patient Instructions (Signed)
Continue twice weekly testing 

## 2012-12-23 NOTE — Progress Notes (Signed)
U/s for growth, BPP 8/8, RI good at 0.60, 0.64. NST alternation with BPP to begin. Total wt gain to date 56 pounds, now stabilizing.

## 2012-12-23 NOTE — Progress Notes (Signed)
U/S(35+2wks)-vtx active fetus BPP 8/8, fluid WNL AFI-13.0cm, anterior Gr 2 placenta, UA Doppler RI-0.62 & 0.60, appears to be nuchal cord x1

## 2012-12-27 ENCOUNTER — Encounter: Payer: Self-pay | Admitting: Obstetrics & Gynecology

## 2012-12-27 ENCOUNTER — Ambulatory Visit (INDEPENDENT_AMBULATORY_CARE_PROVIDER_SITE_OTHER): Payer: Medicaid Other | Admitting: Obstetrics & Gynecology

## 2012-12-27 ENCOUNTER — Other Ambulatory Visit: Payer: Self-pay | Admitting: Obstetrics & Gynecology

## 2012-12-27 VITALS — BP 134/80 | Wt 255.0 lb

## 2012-12-27 DIAGNOSIS — Z1389 Encounter for screening for other disorder: Secondary | ICD-10-CM

## 2012-12-27 DIAGNOSIS — O10013 Pre-existing essential hypertension complicating pregnancy, third trimester: Secondary | ICD-10-CM

## 2012-12-27 DIAGNOSIS — Z331 Pregnant state, incidental: Secondary | ICD-10-CM

## 2012-12-27 DIAGNOSIS — E669 Obesity, unspecified: Secondary | ICD-10-CM

## 2012-12-27 DIAGNOSIS — O10019 Pre-existing essential hypertension complicating pregnancy, unspecified trimester: Secondary | ICD-10-CM

## 2012-12-27 DIAGNOSIS — O99810 Abnormal glucose complicating pregnancy: Secondary | ICD-10-CM | POA: Insufficient documentation

## 2012-12-27 DIAGNOSIS — O0993 Supervision of high risk pregnancy, unspecified, third trimester: Secondary | ICD-10-CM

## 2012-12-27 LAB — POCT URINALYSIS DIPSTICK
Glucose, UA: NEGATIVE
Ketones, UA: NEGATIVE

## 2012-12-27 NOTE — Progress Notes (Signed)
Reactive NST. Sonogram Thursday BP weight and urine results all reviewed and noted. Patient reports good fetal movement, denies any bleeding and no rupture of membranes symptoms or regular contractions. Patient is without complaints. All questions were answered.

## 2012-12-30 ENCOUNTER — Encounter: Payer: Self-pay | Admitting: Advanced Practice Midwife

## 2012-12-30 ENCOUNTER — Ambulatory Visit (INDEPENDENT_AMBULATORY_CARE_PROVIDER_SITE_OTHER): Payer: Medicaid Other | Admitting: Advanced Practice Midwife

## 2012-12-30 ENCOUNTER — Ambulatory Visit (INDEPENDENT_AMBULATORY_CARE_PROVIDER_SITE_OTHER): Payer: Medicaid Other

## 2012-12-30 ENCOUNTER — Encounter: Payer: Medicaid Other | Admitting: Obstetrics & Gynecology

## 2012-12-30 VITALS — BP 128/70 | Wt 254.0 lb

## 2012-12-30 DIAGNOSIS — O10013 Pre-existing essential hypertension complicating pregnancy, third trimester: Secondary | ICD-10-CM

## 2012-12-30 DIAGNOSIS — O10019 Pre-existing essential hypertension complicating pregnancy, unspecified trimester: Secondary | ICD-10-CM

## 2012-12-30 DIAGNOSIS — O99810 Abnormal glucose complicating pregnancy: Secondary | ICD-10-CM

## 2012-12-30 DIAGNOSIS — Z1389 Encounter for screening for other disorder: Secondary | ICD-10-CM

## 2012-12-30 DIAGNOSIS — E669 Obesity, unspecified: Secondary | ICD-10-CM

## 2012-12-30 DIAGNOSIS — Z331 Pregnant state, incidental: Secondary | ICD-10-CM

## 2012-12-30 LAB — POCT URINALYSIS DIPSTICK
Glucose, UA: NEGATIVE
Ketones, UA: NEGATIVE
Leukocytes, UA: NEGATIVE

## 2012-12-30 NOTE — Progress Notes (Signed)
Did not bring BS log.  States that pp are all < 120 and only one FBS > 90 (91).  U/S today normal.  Will continue alternating u/s with NST

## 2012-12-30 NOTE — Progress Notes (Addendum)
U/S(36+2wks)-vtx active fetus BPP 8/8, fluid wnl AFI-12.0cm, anterior Gr 2 placenta, UA Doppler RI 0.64 & 0.59, EFW 6 lb 8 oz (54th%tile), female fetus "Kyrie"

## 2013-01-03 ENCOUNTER — Ambulatory Visit (INDEPENDENT_AMBULATORY_CARE_PROVIDER_SITE_OTHER): Payer: Medicaid Other | Admitting: Women's Health

## 2013-01-03 ENCOUNTER — Encounter: Payer: Self-pay | Admitting: Women's Health

## 2013-01-03 VITALS — BP 130/70 | Wt 256.0 lb

## 2013-01-03 DIAGNOSIS — Z23 Encounter for immunization: Secondary | ICD-10-CM

## 2013-01-03 DIAGNOSIS — Z331 Pregnant state, incidental: Secondary | ICD-10-CM

## 2013-01-03 DIAGNOSIS — E669 Obesity, unspecified: Secondary | ICD-10-CM

## 2013-01-03 DIAGNOSIS — Z1389 Encounter for screening for other disorder: Secondary | ICD-10-CM

## 2013-01-03 DIAGNOSIS — O10019 Pre-existing essential hypertension complicating pregnancy, unspecified trimester: Secondary | ICD-10-CM

## 2013-01-03 DIAGNOSIS — O99810 Abnormal glucose complicating pregnancy: Secondary | ICD-10-CM

## 2013-01-03 DIAGNOSIS — O10913 Unspecified pre-existing hypertension complicating pregnancy, third trimester: Secondary | ICD-10-CM

## 2013-01-03 DIAGNOSIS — O0993 Supervision of high risk pregnancy, unspecified, third trimester: Secondary | ICD-10-CM

## 2013-01-03 LAB — POCT URINALYSIS DIPSTICK
Glucose, UA: NEGATIVE
Leukocytes, UA: NEGATIVE
Nitrite, UA: NEGATIVE

## 2013-01-03 LAB — OB RESULTS CONSOLE GC/CHLAMYDIA: Gonorrhea: NEGATIVE

## 2013-01-03 MED ORDER — INFLUENZA VAC SPLIT QUAD 0.5 ML IM SUSP
0.5000 mL | Freq: Once | INTRAMUSCULAR | Status: AC
  Administered 2013-01-03: 0.5 mL via INTRAMUSCULAR

## 2013-01-03 NOTE — Patient Instructions (Signed)
Third Trimester of Pregnancy  The third trimester is from week 29 through week 42, months 7 through 9. The third trimester is a time when the fetus is growing rapidly. At the end of the ninth month, the fetus is about 20 inches in length and weighs 6 10 pounds.   BODY CHANGES  Your body goes through many changes during pregnancy. The changes vary from woman to woman.    Your weight will continue to increase. You can expect to gain 25 35 pounds (11 16 kg) by the end of the pregnancy.   You may begin to get stretch marks on your hips, abdomen, and breasts.   You may urinate more often because the fetus is moving lower into your pelvis and pressing on your bladder.   You may develop or continue to have heartburn as a result of your pregnancy.   You may develop constipation because certain hormones are causing the muscles that push waste through your intestines to slow down.   You may develop hemorrhoids or swollen, bulging veins (varicose veins).   You may have pelvic pain because of the weight gain and pregnancy hormones relaxing your joints between the bones in your pelvis. Back aches may result from over exertion of the muscles supporting your posture.   Your breasts will continue to grow and be tender. A yellow discharge may leak from your breasts called colostrum.   Your belly button may stick out.   You may feel short of breath because of your expanding uterus.   You may notice the fetus "dropping," or moving lower in your abdomen.   You may have a bloody mucus discharge. This usually occurs a few days to a week before labor begins.   Your cervix becomes thin and soft (effaced) near your due date.  WHAT TO EXPECT AT YOUR PRENATAL EXAMS   You will have prenatal exams every 2 weeks until week 36. Then, you will have weekly prenatal exams. During a routine prenatal visit:   You will be weighed to make sure you and the fetus are growing normally.   Your blood pressure is taken.   Your abdomen will be  measured to track your baby's growth.   The fetal heartbeat will be listened to.   Any test results from the previous visit will be discussed.   You may have a cervical check near your due date to see if you have effaced.  At around 36 weeks, your caregiver will check your cervix. At the same time, your caregiver will also perform a test on the secretions of the vaginal tissue. This test is to determine if a type of bacteria, Group B streptococcus, is present. Your caregiver will explain this further.  Your caregiver may ask you:   What your birth plan is.   How you are feeling.   If you are feeling the baby move.   If you have had any abnormal symptoms, such as leaking fluid, bleeding, severe headaches, or abdominal cramping.   If you have any questions.  Other tests or screenings that may be performed during your third trimester include:   Blood tests that check for low iron levels (anemia).   Fetal testing to check the health, activity level, and growth of the fetus. Testing is done if you have certain medical conditions or if there are problems during the pregnancy.  FALSE LABOR  You may feel small, irregular contractions that eventually go away. These are called Braxton Hicks contractions, or   false labor. Contractions may last for hours, days, or even weeks before true labor sets in. If contractions come at regular intervals, intensify, or become painful, it is best to be seen by your caregiver.   SIGNS OF LABOR    Menstrual-like cramps.   Contractions that are 5 minutes apart or less.   Contractions that start on the top of the uterus and spread down to the lower abdomen and back.   A sense of increased pelvic pressure or back pain.   A watery or bloody mucus discharge that comes from the vagina.  If you have any of these signs before the 37th week of pregnancy, call your caregiver right away. You need to go to the hospital to get checked immediately.  HOME CARE INSTRUCTIONS    Avoid all  smoking, herbs, alcohol, and unprescribed drugs. These chemicals affect the formation and growth of the baby.   Follow your caregiver's instructions regarding medicine use. There are medicines that are either safe or unsafe to take during pregnancy.   Exercise only as directed by your caregiver. Experiencing uterine cramps is a good sign to stop exercising.   Continue to eat regular, healthy meals.   Wear a good support bra for breast tenderness.   Do not use hot tubs, steam rooms, or saunas.   Wear your seat belt at all times when driving.   Avoid raw meat, uncooked cheese, cat litter boxes, and soil used by cats. These carry germs that can cause birth defects in the baby.   Take your prenatal vitamins.   Try taking a stool softener (if your caregiver approves) if you develop constipation. Eat more high-fiber foods, such as fresh vegetables or fruit and whole grains. Drink plenty of fluids to keep your urine clear or pale yellow.   Take warm sitz baths to soothe any pain or discomfort caused by hemorrhoids. Use hemorrhoid cream if your caregiver approves.   If you develop varicose veins, wear support hose. Elevate your feet for 15 minutes, 3 4 times a day. Limit salt in your diet.   Avoid heavy lifting, wear low heal shoes, and practice good posture.   Rest a lot with your legs elevated if you have leg cramps or low back pain.   Visit your dentist if you have not gone during your pregnancy. Use a soft toothbrush to brush your teeth and be gentle when you floss.   A sexual relationship may be continued unless your caregiver directs you otherwise.   Do not travel far distances unless it is absolutely necessary and only with the approval of your caregiver.   Take prenatal classes to understand, practice, and ask questions about the labor and delivery.   Make a trial run to the hospital.   Pack your hospital bag.   Prepare the baby's nursery.   Continue to go to all your prenatal visits as directed  by your caregiver.  SEEK MEDICAL CARE IF:   You are unsure if you are in labor or if your water has broken.   You have dizziness.   You have mild pelvic cramps, pelvic pressure, or nagging pain in your abdominal area.   You have persistent nausea, vomiting, or diarrhea.   You have a bad smelling vaginal discharge.   You have pain with urination.  SEEK IMMEDIATE MEDICAL CARE IF:    You have a fever.   You are leaking fluid from your vagina.   You have spotting or bleeding from your vagina.     You have severe abdominal cramping or pain.   You have rapid weight loss or gain.   You have shortness of breath with chest pain.   You notice sudden or extreme swelling of your face, hands, ankles, feet, or legs.   You have not felt your baby move in over an hour.   You have severe headaches that do not go away with medicine.   You have vision changes.  Document Released: 01/28/2001 Document Revised: 10/06/2012 Document Reviewed: 04/06/2012  [employer] Patient Information 2014 [employer].

## 2013-01-03 NOTE — Progress Notes (Signed)
Reports good fm. Denies uc's, lof, vb, urinary frequency, urgency, hesitancy, or dysuria.  LBP, bilateral hip pain, pressure.  Reviewed relief measures, labor s/s, fkc.  Reactive NST. GBS, GC/CH today. Didn't bring log, but reports all FBS <90 and all 2hr pp <120. All questions answered. F/U in 3d for u/s and visit as scheduled.

## 2013-01-04 LAB — GC/CHLAMYDIA PROBE AMP
CT Probe RNA: NEGATIVE
GC Probe RNA: NEGATIVE

## 2013-01-05 LAB — STREP B DNA PROBE: GBSP: NEGATIVE

## 2013-01-06 ENCOUNTER — Other Ambulatory Visit: Payer: Self-pay | Admitting: Obstetrics & Gynecology

## 2013-01-06 ENCOUNTER — Ambulatory Visit (INDEPENDENT_AMBULATORY_CARE_PROVIDER_SITE_OTHER): Payer: Medicaid Other | Admitting: Obstetrics & Gynecology

## 2013-01-06 ENCOUNTER — Encounter: Payer: Self-pay | Admitting: Women's Health

## 2013-01-06 ENCOUNTER — Encounter: Payer: Self-pay | Admitting: Obstetrics & Gynecology

## 2013-01-06 ENCOUNTER — Other Ambulatory Visit: Payer: Self-pay | Admitting: Advanced Practice Midwife

## 2013-01-06 ENCOUNTER — Ambulatory Visit (INDEPENDENT_AMBULATORY_CARE_PROVIDER_SITE_OTHER): Payer: Medicaid Other

## 2013-01-06 VITALS — BP 130/80 | Wt 255.0 lb

## 2013-01-06 DIAGNOSIS — O10013 Pre-existing essential hypertension complicating pregnancy, third trimester: Secondary | ICD-10-CM

## 2013-01-06 DIAGNOSIS — E669 Obesity, unspecified: Secondary | ICD-10-CM

## 2013-01-06 DIAGNOSIS — O10019 Pre-existing essential hypertension complicating pregnancy, unspecified trimester: Secondary | ICD-10-CM

## 2013-01-06 DIAGNOSIS — Z331 Pregnant state, incidental: Secondary | ICD-10-CM

## 2013-01-06 DIAGNOSIS — O99810 Abnormal glucose complicating pregnancy: Secondary | ICD-10-CM

## 2013-01-06 DIAGNOSIS — O10913 Unspecified pre-existing hypertension complicating pregnancy, third trimester: Secondary | ICD-10-CM

## 2013-01-06 DIAGNOSIS — Z1389 Encounter for screening for other disorder: Secondary | ICD-10-CM

## 2013-01-06 LAB — POCT URINALYSIS DIPSTICK
Glucose, UA: NEGATIVE
Ketones, UA: NEGATIVE
Nitrite, UA: NEGATIVE

## 2013-01-06 NOTE — Progress Notes (Signed)
U/S(37+2wks)-vtx active fetus BPP 8/8, fluid wnl AFI-13.9cm, anterior gr 1 placenta, UA Doppler RI-0.62 & 0.50, female fetus "Kyrie"

## 2013-01-06 NOTE — Progress Notes (Signed)
Sonogram reviewed and report done.  All parameters are good. BP weight and urine results all reviewed and noted. Patient reports good fetal movement, denies any bleeding and no rupture of membranes symptoms or regular contractions. Patient is without complaints. All questions were answered.

## 2013-01-10 ENCOUNTER — Other Ambulatory Visit: Payer: Self-pay | Admitting: Obstetrics & Gynecology

## 2013-01-10 ENCOUNTER — Other Ambulatory Visit: Payer: Medicaid Other

## 2013-01-10 ENCOUNTER — Encounter (HOSPITAL_COMMUNITY): Payer: Self-pay | Admitting: *Deleted

## 2013-01-10 ENCOUNTER — Telehealth (HOSPITAL_COMMUNITY): Payer: Self-pay | Admitting: *Deleted

## 2013-01-10 ENCOUNTER — Encounter: Payer: Self-pay | Admitting: Obstetrics & Gynecology

## 2013-01-10 ENCOUNTER — Ambulatory Visit (INDEPENDENT_AMBULATORY_CARE_PROVIDER_SITE_OTHER): Payer: Medicaid Other | Admitting: Obstetrics & Gynecology

## 2013-01-10 VITALS — BP 130/80 | Wt 255.0 lb

## 2013-01-10 DIAGNOSIS — O10013 Pre-existing essential hypertension complicating pregnancy, third trimester: Secondary | ICD-10-CM

## 2013-01-10 DIAGNOSIS — O0993 Supervision of high risk pregnancy, unspecified, third trimester: Secondary | ICD-10-CM

## 2013-01-10 DIAGNOSIS — O99810 Abnormal glucose complicating pregnancy: Secondary | ICD-10-CM

## 2013-01-10 DIAGNOSIS — E669 Obesity, unspecified: Secondary | ICD-10-CM

## 2013-01-10 DIAGNOSIS — Z1389 Encounter for screening for other disorder: Secondary | ICD-10-CM

## 2013-01-10 DIAGNOSIS — O10019 Pre-existing essential hypertension complicating pregnancy, unspecified trimester: Secondary | ICD-10-CM | POA: Insufficient documentation

## 2013-01-10 DIAGNOSIS — Z331 Pregnant state, incidental: Secondary | ICD-10-CM

## 2013-01-10 LAB — POCT URINALYSIS DIPSTICK
Blood, UA: 1
Glucose, UA: NEGATIVE
Ketones, UA: NEGATIVE
Leukocytes, UA: NEGATIVE
Nitrite, UA: NEGATIVE

## 2013-01-10 NOTE — Telephone Encounter (Signed)
Preadmission screen  

## 2013-01-10 NOTE — Progress Notes (Signed)
Reactive nst, sonogram Wednesday, induction scheduled for Tuesday January 18, 2013, 730 pm BP good, BS good.  Weight stable BP weight and urine results all reviewed and noted. Patient reports good fetal movement, denies any bleeding and no rupture of membranes symptoms or regular contractions. Patient is without complaints. All questions were answered.

## 2013-01-12 ENCOUNTER — Ambulatory Visit (INDEPENDENT_AMBULATORY_CARE_PROVIDER_SITE_OTHER): Payer: Medicaid Other

## 2013-01-12 ENCOUNTER — Encounter: Payer: Self-pay | Admitting: Obstetrics & Gynecology

## 2013-01-12 ENCOUNTER — Ambulatory Visit (INDEPENDENT_AMBULATORY_CARE_PROVIDER_SITE_OTHER): Payer: Medicaid Other | Admitting: Obstetrics & Gynecology

## 2013-01-12 VITALS — BP 140/80 | Wt 257.0 lb

## 2013-01-12 DIAGNOSIS — E669 Obesity, unspecified: Secondary | ICD-10-CM

## 2013-01-12 DIAGNOSIS — O10013 Pre-existing essential hypertension complicating pregnancy, third trimester: Secondary | ICD-10-CM

## 2013-01-12 DIAGNOSIS — Z1389 Encounter for screening for other disorder: Secondary | ICD-10-CM

## 2013-01-12 DIAGNOSIS — O99810 Abnormal glucose complicating pregnancy: Secondary | ICD-10-CM

## 2013-01-12 DIAGNOSIS — Z331 Pregnant state, incidental: Secondary | ICD-10-CM

## 2013-01-12 DIAGNOSIS — O10019 Pre-existing essential hypertension complicating pregnancy, unspecified trimester: Secondary | ICD-10-CM

## 2013-01-12 LAB — POCT URINALYSIS DIPSTICK
Ketones, UA: NEGATIVE
Nitrite, UA: NEGATIVE

## 2013-01-12 NOTE — Progress Notes (Signed)
U/S(38+1wks)-vtx active fetus, appropriate fetus EFW 7 lb 4 oz (53rd%tile), fluid wnl AFI-7.3cm , anterior gr 2 placenta, UA Doppler RI-0.62 & 0.55, BPP 8/8, female fetus "Kyrie"

## 2013-01-12 NOTE — Progress Notes (Signed)
Sonogram looks good, report done, all reassuring BP weight and urine results all reviewed and noted. Patient reports good fetal movement, denies any bleeding and no rupture of membranes symptoms or regular contractions. Patient is without complaints. All questions were answered.

## 2013-01-17 ENCOUNTER — Encounter: Payer: Self-pay | Admitting: Obstetrics & Gynecology

## 2013-01-17 ENCOUNTER — Ambulatory Visit (INDEPENDENT_AMBULATORY_CARE_PROVIDER_SITE_OTHER): Payer: Medicaid Other | Admitting: Obstetrics & Gynecology

## 2013-01-17 VITALS — BP 140/80 | Wt 260.0 lb

## 2013-01-17 DIAGNOSIS — O10013 Pre-existing essential hypertension complicating pregnancy, third trimester: Secondary | ICD-10-CM

## 2013-01-17 DIAGNOSIS — O10019 Pre-existing essential hypertension complicating pregnancy, unspecified trimester: Secondary | ICD-10-CM

## 2013-01-17 DIAGNOSIS — O0993 Supervision of high risk pregnancy, unspecified, third trimester: Secondary | ICD-10-CM

## 2013-01-17 DIAGNOSIS — Z1389 Encounter for screening for other disorder: Secondary | ICD-10-CM

## 2013-01-17 DIAGNOSIS — Z331 Pregnant state, incidental: Secondary | ICD-10-CM

## 2013-01-17 DIAGNOSIS — E669 Obesity, unspecified: Secondary | ICD-10-CM

## 2013-01-17 DIAGNOSIS — O99810 Abnormal glucose complicating pregnancy: Secondary | ICD-10-CM

## 2013-01-17 LAB — POCT URINALYSIS DIPSTICK: Nitrite, UA: NEGATIVE

## 2013-01-17 NOTE — Progress Notes (Signed)
Reactive NST, for induction tomorrow due to chronic hypertension BP weight and urine results all reviewed and noted. Patient reports good fetal movement, denies any bleeding and no rupture of membranes symptoms or regular contractions. Patient is without complaints. All questions were answered.

## 2013-01-18 ENCOUNTER — Encounter (HOSPITAL_COMMUNITY): Payer: Self-pay

## 2013-01-18 ENCOUNTER — Inpatient Hospital Stay (HOSPITAL_COMMUNITY)
Admission: RE | Admit: 2013-01-18 | Discharge: 2013-01-22 | DRG: 766 | Disposition: A | Discharge status: Home / Self Care | Payer: Medicaid Other | Source: Ambulatory Visit | Attending: Obstetrics and Gynecology | Admitting: Obstetrics and Gynecology

## 2013-01-18 VITALS — BP 123/82 | HR 86 | Temp 97.6°F | Resp 18 | Ht 67.0 in | Wt 260.0 lb

## 2013-01-18 DIAGNOSIS — O1002 Pre-existing essential hypertension complicating childbirth: Principal | ICD-10-CM | POA: Diagnosis present

## 2013-01-18 DIAGNOSIS — O10919 Unspecified pre-existing hypertension complicating pregnancy, unspecified trimester: Secondary | ICD-10-CM | POA: Diagnosis present

## 2013-01-18 DIAGNOSIS — O99814 Abnormal glucose complicating childbirth: Secondary | ICD-10-CM | POA: Diagnosis present

## 2013-01-18 DIAGNOSIS — Z98891 History of uterine scar from previous surgery: Secondary | ICD-10-CM

## 2013-01-18 DIAGNOSIS — O330 Maternal care for disproportion due to deformity of maternal pelvic bones: Secondary | ICD-10-CM | POA: Diagnosis present

## 2013-01-18 DIAGNOSIS — O10913 Unspecified pre-existing hypertension complicating pregnancy, third trimester: Secondary | ICD-10-CM

## 2013-01-18 DIAGNOSIS — O0993 Supervision of high risk pregnancy, unspecified, third trimester: Secondary | ICD-10-CM

## 2013-01-18 DIAGNOSIS — O339 Maternal care for disproportion, unspecified: Secondary | ICD-10-CM | POA: Diagnosis present

## 2013-01-18 DIAGNOSIS — O169 Unspecified maternal hypertension, unspecified trimester: Secondary | ICD-10-CM | POA: Diagnosis present

## 2013-01-18 LAB — CBC
HCT: 36.9 % (ref 36.0–46.0)
MCHC: 35 g/dL (ref 30.0–36.0)
MCV: 88.7 fL (ref 78.0–100.0)
RBC: 4.16 MIL/uL (ref 3.87–5.11)
RDW: 13.9 % (ref 11.5–15.5)
WBC: 8.5 10*3/uL (ref 4.0–10.5)

## 2013-01-18 MED ORDER — ACETAMINOPHEN 325 MG PO TABS
650.0000 mg | ORAL_TABLET | ORAL | Status: DC | PRN
  Administered 2013-01-19: 650 mg via ORAL
  Filled 2013-01-18: qty 2

## 2013-01-18 MED ORDER — LACTATED RINGERS IV SOLN
INTRAVENOUS | Status: DC
  Administered 2013-01-18: 22:00:00 via INTRAVENOUS
  Administered 2013-01-19: 125 mL/h via INTRAVENOUS
  Administered 2013-01-19 (×3): via INTRAVENOUS

## 2013-01-18 MED ORDER — TERBUTALINE SULFATE 1 MG/ML IJ SOLN: 0.2500 mg | Freq: Once | INTRAMUSCULAR | Status: AC | PRN

## 2013-01-18 MED ORDER — EPHEDRINE 5 MG/ML INJ: 10.0000 mg | INTRAVENOUS | Status: DC | PRN

## 2013-01-18 MED ORDER — MISOPROSTOL 25 MCG QUARTER TABLET
25.0000 ug | ORAL_TABLET | ORAL | Status: DC | PRN
  Administered 2013-01-18: 25 ug via VAGINAL
  Filled 2013-01-18: qty 0.25

## 2013-01-18 MED ORDER — CITRIC ACID-SODIUM CITRATE 334-500 MG/5ML PO SOLN
30.0000 mL | ORAL | Status: DC | PRN
  Administered 2013-01-19: 30 mL via ORAL
  Filled 2013-01-18: qty 15

## 2013-01-18 MED ORDER — ONDANSETRON HCL 4 MG/2ML IJ SOLN: 4.0000 mg | Freq: Four times a day (QID) | INTRAMUSCULAR | Status: DC | PRN

## 2013-01-18 MED ORDER — IBUPROFEN 600 MG PO TABS: 600.0000 mg | ORAL_TABLET | Freq: Four times a day (QID) | ORAL | Status: DC | PRN

## 2013-01-18 MED ORDER — OXYTOCIN 40 UNITS IN LACTATED RINGERS INFUSION - SIMPLE MED: 62.5000 mL/h | INTRAVENOUS | Status: DC

## 2013-01-18 MED ORDER — DIPHENHYDRAMINE HCL 50 MG/ML IJ SOLN: 12.5000 mg | INTRAMUSCULAR | Status: DC | PRN

## 2013-01-18 MED ORDER — FENTANYL 2.5 MCG/ML BUPIVACAINE 1/10 % EPIDURAL INFUSION (WH - ANES)
14.0000 mL/h | INTRAMUSCULAR | Status: DC | PRN
  Administered 2013-01-19 (×2): 14 mL/h via EPIDURAL
  Filled 2013-01-18 (×2): qty 125

## 2013-01-18 MED ORDER — LIDOCAINE HCL (PF) 1 % IJ SOLN: 30.0000 mL | INTRAMUSCULAR | Status: DC | PRN

## 2013-01-18 MED ORDER — LACTATED RINGERS IV SOLN: 500.0000 mL | Freq: Once | INTRAVENOUS | Status: DC

## 2013-01-18 MED ORDER — LACTATED RINGERS IV SOLN
500.0000 mL | INTRAVENOUS | Status: DC | PRN
  Administered 2013-01-19: 250 mL via INTRAVENOUS
  Administered 2013-01-19: 500 mL via INTRAVENOUS

## 2013-01-18 MED ORDER — METHYLDOPA 500 MG PO TABS
500.0000 mg | ORAL_TABLET | Freq: Two times a day (BID) | ORAL | Status: DC
  Administered 2013-01-19: 500 mg via ORAL
  Filled 2013-01-18 (×4): qty 1

## 2013-01-18 MED ORDER — PHENYLEPHRINE 40 MCG/ML (10ML) SYRINGE FOR IV PUSH (FOR BLOOD PRESSURE SUPPORT)
80.0000 ug | PREFILLED_SYRINGE | INTRAVENOUS | Status: DC | PRN
  Filled 2013-01-18: qty 10

## 2013-01-18 MED ORDER — PHENYLEPHRINE 40 MCG/ML (10ML) SYRINGE FOR IV PUSH (FOR BLOOD PRESSURE SUPPORT): 80.0000 ug | PREFILLED_SYRINGE | INTRAVENOUS | Status: DC | PRN

## 2013-01-18 MED ORDER — OXYTOCIN BOLUS FROM INFUSION: 500.0000 mL | INTRAVENOUS | Status: DC

## 2013-01-18 MED ORDER — OXYCODONE-ACETAMINOPHEN 5-325 MG PO TABS: 1.0000 | ORAL_TABLET | ORAL | Status: DC | PRN

## 2013-01-18 MED ORDER — EPHEDRINE 5 MG/ML INJ
10.0000 mg | INTRAVENOUS | Status: DC | PRN
  Filled 2013-01-18: qty 4

## 2013-01-18 NOTE — H&P (Signed)
Donna Moore is a 32 y.o. female G1P0 at [redacted]w[redacted]d (by 7w U/S) presenting for IOL due to chronic HTN.   She has no acute complaints, denies HA, vision changes, RUQ/epigastric pain, swelling. No LOF, VB, regular contractions. + GFM.   She has been followed at [facility] for [facility]. Pregnancy has been significant for HTN on aldomet 500mg  BID and A1GDM (2hr gluc: 83/199/135). Her FBS's have been in the 80s recently.   History OB History   Grav Para Term Preterm Abortions TAB SAB Ect Mult Living   1              Past Medical History  Diagnosis Date  . Heart murmur   . Pregnancy 06/21/2012  . Gestational diabetes   . Pregnancy induced hypertension    Past Surgical History  Procedure Laterality Date  . Cyst drained      Emergency surg, and in hospital for 1 week.   Family History: family history includes Diabetes in her brother and mother; Hypertension in her father and mother. Social History:  reports that she quit smoking about a year ago. Her smoking use included Cigarettes. She smoked 0.00 packs per day for 1 year. She has never used smokeless tobacco. She reports that she does not drink alcohol or use illicit drugs.   Prenatal Transfer Tool  Maternal Diabetes: Yes:  Diabetes Type:  Diet controlled Genetic Screening: Normal Maternal Ultrasounds/Referrals: Normal Fetal Ultrasounds or other Referrals:  None Maternal Substance Abuse:  No Significant Maternal Medications:  Meds include: Other: aldomet 500mg  TID Significant Maternal Lab Results:  Lab values include: Group B Strep negative Other Comments:  Chronic HTN  ROS As per HPI, otherwise negative.    Blood pressure 145/82, pulse 101, temperature 98.6 F (37 C), temperature source Oral, resp. rate 18, height 5\' 7"  (1.702 m), weight 117.935 kg (260 lb), last menstrual period 04/20/2012. Maternal Exam:  Introitus: Vulva is negative for lesion.  Vagina is negative for discharge.    Physical Exam  Constitutional: She is oriented to  person, place, and time. She appears well-developed and well-nourished.  HENT:  Head: Normocephalic.  Mouth/Throat: Oropharynx is clear and moist.  Eyes: EOM are normal. Pupils are equal, round, and reactive to light.  Neck: Normal range of motion. Neck supple.  Cardiovascular: Normal rate, regular rhythm and normal heart sounds.   Respiratory: Effort normal and breath sounds normal. No respiratory distress.  GI: Soft. There is no tenderness.  Genitourinary: Vulva exhibits no lesion. No vaginal discharge found.  Musculoskeletal: Normal range of motion.  Neurological: She is alert and oriented to person, place, and time.  Skin: Skin is warm.    FHT: 130 Baseline, moderate variability, 15x15 accelerations present, no decelerations present Toco: Irregular contractions every 5-8 minutes.   Prenatal labs: ABO, Rh: A/POS/-- (05/05 1439) Antibody: NEG (09/16 0908) Rubella: 4.41 (05/05 1439) RPR: NON REAC (09/16 0908)  HBsAg: NEGATIVE (05/05 1439)  HIV: NON REACTIVE (09/16 0908)  GBS: NEGATIVE (11/17 1547)   Assessment/Plan: Donna Moore is a 32 y.o. G1P0 at [redacted]w[redacted]d presenting for IOL due to chronic HTN  #Labor: Cervical ripening with cytotec followed by pitocin.>>Cervix rechecked by   #Pain: Epidural upon request #FWB: Category I tracing #ID:  GBS neg #MOF: Breast feeding #MOC: Undecided #Circ:  Yes, as in patient if able  Continue aldomet 500mg  BID Anticipate NSVD  Donna Moore 01/18/2013, 8:40 PM  Quad screen normal  [facility]  Pap 2013 normal  GC/CT Initial:   -/-  36+wks:   -/-  Genetic Screen NT/IT: neg  CF screen neg  Anatomic [place] Normal female 'Donna Moore'  Flu vaccine 01/03/13  Glucose Screen  2 hr abnormal: 83/199/135  GBS neg  Feed Preference Breast/pump  Contraception pills  Circumcision Yes   Childbirth Classes recommended  Pediatrician undecided   Dilation: 4 Effacement (%): 80 Cervical Position: Anterior Station: -2 Presentation:  Vertex Exam by:: [place] CNM  Start pitocin 4 hours after cytotec dose.  PIH labs.  I was present for the exam and agree with above.  Robie [place], CNM 01/19/2013 12:55 AM

## 2013-01-19 ENCOUNTER — Encounter (HOSPITAL_COMMUNITY): Payer: Self-pay

## 2013-01-19 ENCOUNTER — Encounter (HOSPITAL_COMMUNITY): Payer: Medicaid Other | Admitting: Anesthesiology

## 2013-01-19 ENCOUNTER — Observation Stay (HOSPITAL_COMMUNITY): Payer: Medicaid Other | Admitting: Anesthesiology

## 2013-01-19 ENCOUNTER — Encounter (HOSPITAL_COMMUNITY)
Admission: RE | Disposition: A | Discharge status: Home / Self Care | Payer: Self-pay | Source: Ambulatory Visit | Attending: Obstetrics and Gynecology

## 2013-01-19 DIAGNOSIS — O169 Unspecified maternal hypertension, unspecified trimester: Secondary | ICD-10-CM | POA: Diagnosis present

## 2013-01-19 DIAGNOSIS — O139 Gestational [pregnancy-induced] hypertension without significant proteinuria, unspecified trimester: Secondary | ICD-10-CM

## 2013-01-19 LAB — CBC
HCT: 36.2 % (ref 36.0–46.0)
HCT: 36.2 % (ref 36.0–46.0)
Hemoglobin: 12.6 g/dL (ref 12.0–15.0)
MCH: 31.1 pg (ref 26.0–34.0)
MCH: 30.9 pg (ref 26.0–34.0)
MCHC: 34.8 g/dL (ref 30.0–36.0)
MCV: 88.5 fL (ref 78.0–100.0)
MCV: 88.7 fL (ref 78.0–100.0)
Platelets: 198 10*3/uL (ref 150–400)
RBC: 4.09 MIL/uL (ref 3.87–5.11)
RBC: 4.08 MIL/uL (ref 3.87–5.11)
RDW: 13.9 % (ref 11.5–15.5)
WBC: 9.5 10*3/uL (ref 4.0–10.5)

## 2013-01-19 LAB — URINALYSIS, ROUTINE W REFLEX MICROSCOPIC
Bilirubin Urine: NEGATIVE
Ketones, ur: 15 mg/dL — AB
Nitrite: NEGATIVE
Specific Gravity, Urine: >1.03 — ABNORMAL HIGH (ref 1.005–1.030)
Urobilinogen, UA: 0.2 mg/dL (ref 0.0–1.0)

## 2013-01-19 LAB — COMPREHENSIVE METABOLIC PANEL
BUN: 7 mg/dL (ref 6–23)
CO2: 20 mEq/L (ref 19–32)
Calcium: 8.9 mg/dL (ref 8.4–10.5)
Creatinine, Ser: 0.68 mg/dL (ref 0.50–1.10)
GFR calc Af Amer: >90 mL/min (ref >90)
GFR calc non Af Amer: >90 mL/min (ref >90)
Glucose, Bld: 126 mg/dL — ABNORMAL HIGH (ref 70–99)
Total Bilirubin: 0.1 mg/dL — ABNORMAL LOW (ref 0.3–1.2)

## 2013-01-19 LAB — GLUCOSE, CAPILLARY
Glucose-Capillary: 68 mg/dL — ABNORMAL LOW (ref 70–99)
Glucose-Capillary: 97 mg/dL (ref 70–99)

## 2013-01-19 LAB — URINE MICROSCOPIC-ADD ON

## 2013-01-19 LAB — TYPE AND SCREEN
ABO/RH(D): A POS
Antibody Screen: NEGATIVE

## 2013-01-19 LAB — RPR: RPR Ser Ql: NONREACTIVE

## 2013-01-19 SURGERY — Surgical Case
Anesthesia: Epidural | Site: Abdomen

## 2013-01-19 MED ORDER — DIPHENHYDRAMINE HCL 50 MG/ML IJ SOLN: 25.0000 mg | INTRAMUSCULAR | Status: DC | PRN

## 2013-01-19 MED ORDER — LACTATED RINGERS IV SOLN
INTRAVENOUS | Status: DC
  Administered 2013-01-20: 06:00:00 via INTRAVENOUS

## 2013-01-19 MED ORDER — ONDANSETRON HCL 4 MG PO TABS: 4.0000 mg | ORAL_TABLET | ORAL | Status: DC | PRN

## 2013-01-19 MED ORDER — TERBUTALINE SULFATE 1 MG/ML IJ SOLN: 0.2500 mg | Freq: Once | INTRAMUSCULAR | Status: DC | PRN

## 2013-01-19 MED ORDER — ONDANSETRON HCL 4 MG/2ML IJ SOLN
INTRAMUSCULAR | Status: AC
  Filled 2013-01-19: qty 2

## 2013-01-19 MED ORDER — IBUPROFEN 600 MG PO TABS: 600.0000 mg | ORAL_TABLET | Freq: Four times a day (QID) | ORAL | Status: DC | PRN

## 2013-01-19 MED ORDER — DIBUCAINE 1 % RE OINT: 1.0000 "application " | TOPICAL_OINTMENT | RECTAL | Status: DC | PRN

## 2013-01-19 MED ORDER — FENTANYL CITRATE 0.05 MG/ML IJ SOLN
25.0000 ug | INTRAMUSCULAR | Status: DC | PRN
  Administered 2013-01-19 (×2): 50 ug via INTRAVENOUS

## 2013-01-19 MED ORDER — LACTATED RINGERS IV SOLN
INTRAVENOUS | Status: DC | PRN
  Administered 2013-01-19: 18:00:00 via INTRAVENOUS

## 2013-01-19 MED ORDER — IBUPROFEN 600 MG PO TABS
600.0000 mg | ORAL_TABLET | Freq: Four times a day (QID) | ORAL | Status: DC
  Administered 2013-01-20 – 2013-01-22 (×9): 600 mg via ORAL
  Filled 2013-01-19 (×9): qty 1

## 2013-01-19 MED ORDER — WITCH HAZEL-GLYCERIN EX PADS: 1.0000 "application " | MEDICATED_PAD | CUTANEOUS | Status: DC | PRN

## 2013-01-19 MED ORDER — OXYCODONE-ACETAMINOPHEN 5-325 MG PO TABS
1.0000 | ORAL_TABLET | ORAL | Status: DC | PRN
  Administered 2013-01-20: 1 via ORAL
  Administered 2013-01-20 (×2): 2 via ORAL
  Administered 2013-01-20: 1 via ORAL
  Administered 2013-01-21: 2 via ORAL
  Administered 2013-01-21: 1 via ORAL
  Administered 2013-01-21: 2 via ORAL
  Administered 2013-01-21: 1 via ORAL
  Administered 2013-01-21 (×2): 2 via ORAL
  Administered 2013-01-22: 1 via ORAL
  Filled 2013-01-19 (×2): qty 2
  Filled 2013-01-19 (×3): qty 1
  Filled 2013-01-19 (×5): qty 2
  Filled 2013-01-19: qty 1

## 2013-01-19 MED ORDER — OXYTOCIN 10 UNIT/ML IJ SOLN
40.0000 [IU] | INTRAVENOUS | Status: DC | PRN
  Administered 2013-01-19: 40 [IU] via INTRAVENOUS

## 2013-01-19 MED ORDER — NALBUPHINE HCL 10 MG/ML IJ SOLN
5.0000 mg | INTRAMUSCULAR | Status: DC | PRN
  Filled 2013-01-19: qty 1

## 2013-01-19 MED ORDER — ACETAMINOPHEN 500 MG PO TABS
1000.0000 mg | ORAL_TABLET | Freq: Four times a day (QID) | ORAL | Status: AC
  Administered 2013-01-19 – 2013-01-20 (×2): 1000 mg via ORAL
  Filled 2013-01-19 (×2): qty 2

## 2013-01-19 MED ORDER — MIDAZOLAM HCL 2 MG/2ML IJ SOLN: 0.5000 mg | Freq: Once | INTRAMUSCULAR | Status: DC | PRN

## 2013-01-19 MED ORDER — SODIUM BICARBONATE 8.4 % IV SOLN
INTRAVENOUS | Status: AC
  Filled 2013-01-19: qty 50

## 2013-01-19 MED ORDER — FLEET ENEMA 7-19 GM/118ML RE ENEM: 1.0000 | ENEMA | Freq: Every day | RECTAL | Status: DC | PRN

## 2013-01-19 MED ORDER — SODIUM CHLORIDE 0.9 % IJ SOLN: 3.0000 mL | INTRAMUSCULAR | Status: DC | PRN

## 2013-01-19 MED ORDER — BISACODYL 10 MG RE SUPP: 10.0000 mg | Freq: Every day | RECTAL | Status: DC | PRN

## 2013-01-19 MED ORDER — MENTHOL 3 MG MT LOZG: 1.0000 | LOZENGE | OROMUCOSAL | Status: DC | PRN

## 2013-01-19 MED ORDER — PRENATAL MULTIVITAMIN CH
1.0000 | ORAL_TABLET | Freq: Every day | ORAL | Status: DC
  Administered 2013-01-20 – 2013-01-21 (×2): 1 via ORAL
  Filled 2013-01-19 (×2): qty 1

## 2013-01-19 MED ORDER — MORPHINE SULFATE 0.5 MG/ML IJ SOLN
INTRAMUSCULAR | Status: AC
  Filled 2013-01-19: qty 10

## 2013-01-19 MED ORDER — MORPHINE SULFATE (PF) 0.5 MG/ML IJ SOLN
INTRAMUSCULAR | Status: DC | PRN
  Administered 2013-01-19: 4 mg via EPIDURAL
  Administered 2013-01-19: 1 mg via INTRAVENOUS

## 2013-01-19 MED ORDER — MEPERIDINE HCL 25 MG/ML IJ SOLN: 6.2500 mg | INTRAMUSCULAR | Status: DC | PRN

## 2013-01-19 MED ORDER — ONDANSETRON HCL 4 MG/2ML IJ SOLN: 4.0000 mg | Freq: Three times a day (TID) | INTRAMUSCULAR | Status: DC | PRN

## 2013-01-19 MED ORDER — OXYTOCIN 10 UNIT/ML IJ SOLN
INTRAMUSCULAR | Status: AC
  Filled 2013-01-19: qty 4

## 2013-01-19 MED ORDER — CEFAZOLIN SODIUM 1-5 GM-% IV SOLN
INTRAVENOUS | Status: DC | PRN
  Administered 2013-01-19: 2 g via INTRAVENOUS

## 2013-01-19 MED ORDER — ZOLPIDEM TARTRATE 5 MG PO TABS: 5.0000 mg | ORAL_TABLET | Freq: Every evening | ORAL | Status: DC | PRN

## 2013-01-19 MED ORDER — KETOROLAC TROMETHAMINE 30 MG/ML IJ SOLN
INTRAMUSCULAR | Status: AC
  Filled 2013-01-19: qty 1

## 2013-01-19 MED ORDER — KETOROLAC TROMETHAMINE 30 MG/ML IJ SOLN: 30.0000 mg | Freq: Four times a day (QID) | INTRAMUSCULAR | Status: DC | PRN

## 2013-01-19 MED ORDER — DIPHENHYDRAMINE HCL 25 MG PO CAPS: 25.0000 mg | ORAL_CAPSULE | ORAL | Status: DC | PRN

## 2013-01-19 MED ORDER — NALOXONE HCL 0.4 MG/ML IJ SOLN: 0.4000 mg | INTRAMUSCULAR | Status: DC | PRN

## 2013-01-19 MED ORDER — KETOROLAC TROMETHAMINE 30 MG/ML IJ SOLN
30.0000 mg | Freq: Four times a day (QID) | INTRAMUSCULAR | Status: DC | PRN
  Administered 2013-01-19: 30 mg via INTRAVENOUS

## 2013-01-19 MED ORDER — ONDANSETRON HCL 4 MG/2ML IJ SOLN: 4.0000 mg | INTRAMUSCULAR | Status: DC | PRN

## 2013-01-19 MED ORDER — SIMETHICONE 80 MG PO CHEW
80.0000 mg | CHEWABLE_TABLET | ORAL | Status: DC
  Administered 2013-01-20 – 2013-01-21 (×3): 80 mg via ORAL
  Filled 2013-01-19 (×3): qty 1

## 2013-01-19 MED ORDER — PHENYLEPHRINE 40 MCG/ML (10ML) SYRINGE FOR IV PUSH (FOR BLOOD PRESSURE SUPPORT)
PREFILLED_SYRINGE | INTRAVENOUS | Status: AC
  Filled 2013-01-19: qty 5

## 2013-01-19 MED ORDER — SIMETHICONE 80 MG PO CHEW
80.0000 mg | CHEWABLE_TABLET | ORAL | Status: DC | PRN
  Administered 2013-01-19: 80 mg via ORAL

## 2013-01-19 MED ORDER — DIPHENHYDRAMINE HCL 50 MG/ML IJ SOLN: 12.5000 mg | INTRAMUSCULAR | Status: DC | PRN

## 2013-01-19 MED ORDER — FENTANYL CITRATE 0.05 MG/ML IJ SOLN
INTRAMUSCULAR | Status: AC
  Filled 2013-01-19: qty 2

## 2013-01-19 MED ORDER — TETANUS-DIPHTH-ACELL PERTUSSIS 5-2.5-18.5 LF-MCG/0.5 IM SUSP
0.5000 mL | Freq: Once | INTRAMUSCULAR | Status: AC
  Administered 2013-01-20: 0.5 mL via INTRAMUSCULAR
  Filled 2013-01-19: qty 0.5

## 2013-01-19 MED ORDER — CEFAZOLIN SODIUM-DEXTROSE 2-3 GM-% IV SOLR
INTRAVENOUS | Status: AC
  Filled 2013-01-19: qty 50

## 2013-01-19 MED ORDER — METOCLOPRAMIDE HCL 5 MG/ML IJ SOLN: 10.0000 mg | Freq: Three times a day (TID) | INTRAMUSCULAR | Status: DC | PRN

## 2013-01-19 MED ORDER — SCOPOLAMINE 1 MG/3DAYS TD PT72
1.0000 | MEDICATED_PATCH | Freq: Once | TRANSDERMAL | Status: DC
  Administered 2013-01-19: 1.5 mg via TRANSDERMAL

## 2013-01-19 MED ORDER — LIDOCAINE-EPINEPHRINE (PF) 2 %-1:200000 IJ SOLN
INTRAMUSCULAR | Status: AC
  Filled 2013-01-19: qty 20

## 2013-01-19 MED ORDER — PROMETHAZINE HCL 25 MG/ML IJ SOLN: 6.2500 mg | INTRAMUSCULAR | Status: DC | PRN

## 2013-01-19 MED ORDER — SIMETHICONE 80 MG PO CHEW
80.0000 mg | CHEWABLE_TABLET | Freq: Three times a day (TID) | ORAL | Status: DC
  Administered 2013-01-20 – 2013-01-22 (×5): 80 mg via ORAL
  Filled 2013-01-19 (×6): qty 1

## 2013-01-19 MED ORDER — SENNOSIDES-DOCUSATE SODIUM 8.6-50 MG PO TABS
2.0000 | ORAL_TABLET | ORAL | Status: DC
  Administered 2013-01-19 – 2013-01-21 (×3): 2 via ORAL
  Filled 2013-01-19 (×3): qty 2

## 2013-01-19 MED ORDER — DIPHENHYDRAMINE HCL 25 MG PO CAPS: 25.0000 mg | ORAL_CAPSULE | Freq: Four times a day (QID) | ORAL | Status: DC | PRN

## 2013-01-19 MED ORDER — ONDANSETRON HCL 4 MG/2ML IJ SOLN
INTRAMUSCULAR | Status: DC | PRN
  Administered 2013-01-19: 4 mg via INTRAVENOUS

## 2013-01-19 MED ORDER — OXYTOCIN 40 UNITS IN LACTATED RINGERS INFUSION - SIMPLE MED
1.0000 m[IU]/min | INTRAVENOUS | Status: DC
  Administered 2013-01-19: 2 m[IU]/min via INTRAVENOUS
  Administered 2013-01-19: 12 m[IU]/min via INTRAVENOUS
  Filled 2013-01-19: qty 1000

## 2013-01-19 MED ORDER — METOCLOPRAMIDE HCL 5 MG/ML IJ SOLN
INTRAMUSCULAR | Status: DC | PRN
  Administered 2013-01-19: 10 mg via INTRAVENOUS

## 2013-01-19 MED ORDER — PHENYLEPHRINE HCL 10 MG/ML IJ SOLN
INTRAMUSCULAR | Status: DC | PRN
  Administered 2013-01-19: 80 ug via INTRAVENOUS
  Administered 2013-01-19: 40 ug via INTRAVENOUS
  Administered 2013-01-19: 80 ug via INTRAVENOUS

## 2013-01-19 MED ORDER — OXYTOCIN 40 UNITS IN LACTATED RINGERS INFUSION - SIMPLE MED: 62.5000 mL/h | INTRAVENOUS | Status: AC

## 2013-01-19 MED ORDER — SCOPOLAMINE 1 MG/3DAYS TD PT72
MEDICATED_PATCH | TRANSDERMAL | Status: AC
  Filled 2013-01-19: qty 1

## 2013-01-19 MED ORDER — LACTATED RINGERS IV SOLN
INTRAVENOUS | Status: DC | PRN
  Administered 2013-01-19: 19:00:00 via INTRAVENOUS

## 2013-01-19 MED ORDER — NALOXONE HCL 1 MG/ML IJ SOLN
1.0000 ug/kg/h | INTRAVENOUS | Status: DC | PRN
  Filled 2013-01-19: qty 2

## 2013-01-19 MED ORDER — LANOLIN HYDROUS EX OINT: 1.0000 "application " | TOPICAL_OINTMENT | CUTANEOUS | Status: DC | PRN

## 2013-01-19 MED ORDER — SODIUM BICARBONATE 8.4 % IV SOLN
INTRAVENOUS | Status: DC | PRN
  Administered 2013-01-19 (×2): 5 mL via EPIDURAL
  Administered 2013-01-19: 10 mL via EPIDURAL
  Administered 2013-01-19: 5 mL via EPIDURAL

## 2013-01-19 SURGICAL SUPPLY — 35 items
APL SKNCLS STERI-STRIP NONHPOA (GAUZE/BANDAGES/DRESSINGS) ×1
BENZOIN TINCTURE PRP APPL 2/3 (GAUZE/BANDAGES/DRESSINGS) ×1 IMPLANT
CLAMP CORD UMBIL (MISCELLANEOUS) IMPLANT
CLOTH BEACON ORANGE TIMEOUT ST (SAFETY) ×2 IMPLANT
DRAPE LG THREE QUARTER DISP (DRAPES) IMPLANT
DRSG OPSITE POSTOP 4X10 (GAUZE/BANDAGES/DRESSINGS) ×2 IMPLANT
DURAPREP 26ML APPLICATOR (WOUND CARE) ×2 IMPLANT
ELECT REM PT RETURN 9FT ADLT (ELECTROSURGICAL) ×2
ELECTRODE REM PT RTRN 9FT ADLT (ELECTROSURGICAL) ×1 IMPLANT
EXTRACTOR VACUUM KIWI (MISCELLANEOUS) IMPLANT
GLOVE BIO SURGEON ST LM GN SZ9 (GLOVE) ×2 IMPLANT
GLOVE BIOGEL PI IND STRL 9 (GLOVE) ×1 IMPLANT
GLOVE BIOGEL PI INDICATOR 9 (GLOVE) ×1
GOWN PREVENTION PLUS XLARGE (GOWN DISPOSABLE) ×2 IMPLANT
GOWN STRL REIN 3XL LVL4 (GOWN DISPOSABLE) ×2 IMPLANT
GOWN STRL REIN XL XLG (GOWN DISPOSABLE) IMPLANT
NDL HYPO 25X5/8 SAFETYGLIDE (NEEDLE) IMPLANT
NEEDLE HYPO 25X5/8 SAFETYGLIDE (NEEDLE) IMPLANT
NS IRRIG 1000ML POUR BTL (IV SOLUTION) ×2 IMPLANT
PACK C SECTION WH (CUSTOM PROCEDURE TRAY) ×2 IMPLANT
PAD OB MATERNITY 4.3X12.25 (PERSONAL CARE ITEMS) ×2 IMPLANT
RETRACTOR WND ALEXIS 25 LRG (MISCELLANEOUS) IMPLANT
RTRCTR C-SECT PINK 25CM LRG (MISCELLANEOUS) IMPLANT
RTRCTR WOUND ALEXIS 25CM LRG (MISCELLANEOUS)
STRIP CLOSURE SKIN 1/2X4 (GAUZE/BANDAGES/DRESSINGS) ×1 IMPLANT
SUT CHROMIC 0 CTX 36 (SUTURE) ×4 IMPLANT
SUT VIC AB 0 CT1 27 (SUTURE) ×2
SUT VIC AB 0 CT1 27XBRD ANBCTR (SUTURE) ×1 IMPLANT
SUT VIC AB 2-0 CT1 27 (SUTURE) ×4
SUT VIC AB 2-0 CT1 TAPERPNT 27 (SUTURE) ×2 IMPLANT
SUT VIC AB 4-0 KS 27 (SUTURE) ×2 IMPLANT
SYR BULB IRRIGATION 50ML (SYRINGE) IMPLANT
TOWEL OR 17X24 6PK STRL BLUE (TOWEL DISPOSABLE) ×2 IMPLANT
TRAY FOLEY CATH 14FR (SET/KITS/TRAYS/PACK) ×1 IMPLANT
WATER STERILE IRR 1000ML POUR (IV SOLUTION) ×1 IMPLANT

## 2013-01-19 NOTE — Progress Notes (Signed)
An exposure to the patients blood/body fluids has occurred.  Pretest counseling has been done.  Patient denies any risk  factors for HIV or Hepatitis.  No questions voiced. Agreeable to testing.

## 2013-01-19 NOTE — Progress Notes (Signed)
Donna Moore is a 32 y.o. G1P0 at [redacted]w[redacted]d by ultrasound admitted for induction of labor due to [facility].  Subjective: Pt has been on pitocin , with ROM and IUPC, now no progress in 12 + hours despite an optimal labor pattern on IUPC monitoring. Pelvis is android, and there's no thrust/descent with contractions  Objective: BP 129/71  Pulse 65  Temp(Src) 97.7 F (36.5 C) (Oral)  Resp 18  Ht 5\' 7"  (1.702 m)  Wt 117.935 kg (260 lb)  BMI 40.71 kg/m2  SpO2 96%  LMP 04/20/2012      FHT:  FHR: 145 bpm, variability: moderate,  accelerations:  Present,  decelerations:  Absent UC:   regular, every 3 minutes SVE:   Dilation: 5.5 Effacement (%): 90 Station: -1 Exam by:: H Koran RNC  Labs: Lab Results  Component Value Date   WBC 9.5 01/19/2013   HGB 12.7 01/19/2013   HCT 36.2 01/19/2013   MCV 88.5 01/19/2013   PLT 198 01/19/2013    Assessment / Plan: Arrest of decent  Labor: cesarean section recommended, procedure reviewed, risks, alternatives reviewed, will proceed toward nom-emergent cesarean for CPD when OR available. Preeclampsia:  none Fetal Wellbeing:  Category I Pain Control:  Epidural I/D:  n/a Anticipated MOD:  low transverse cesarean section  Graciano Batson V 01/19/2013, 5:56 PM

## 2013-01-19 NOTE — Progress Notes (Signed)
Donna Moore is a 32 y.o. G1P0 at [redacted]w[redacted]d by ultrasound admitted for induction of labor due to Hypertension.  Subjective: Doing well. Pain controlled. No PIH symptoms of headache, scotomata, RUQ pain.  Objective: BP 127/74  Pulse 79  Temp(Src) 97.9 F (36.6 C) (Oral)  Resp 20  Ht 5\' 7"  (1.702 m)  Wt 117.935 kg (260 lb)  BMI 40.71 kg/m2  SpO2 96%  LMP 04/20/2012      FHT:  FHR: 130 bpm, variability: moderate,  accelerations:  Present,  decelerations:  Absent UC:   Irregular every 2-3 minutes with some couplets that decrease with decrease in pit SVE:   Dilation: 5 Effacement (%): 80 Station: 0 Membranes: Intact Presentation: Vertex Exam by:: Dr Corrin Parker  Labs: Lab Results  Component Value Date   WBC 9.5 01/19/2013   HGB 12.7 01/19/2013   HCT 36.2 01/19/2013   MCV 88.5 01/19/2013   PLT 198 01/19/2013    Assessment / Plan: Induction of labor due to chronic hypertension. Not progressing well on pitocin and occasional tachysystole requiring decrease in pitocin.  Labor: Not progressing on pitocin, will AROM and IUPC placement to better assess contractions.  Preeclampsia:  labs stable Fetal Wellbeing:  Category I Pain Control:  Epidural I/D:  GBS neg Anticipated MOD:  NSVD  Pior, Jearld Lesch 01/19/2013, 10:24 AM  I spoke with and examined patient and agree with resident's note and plan of care.  Tawana Scale, MD OB Fellow 01/19/2013 11:01 AM

## 2013-01-19 NOTE — Anesthesia Preprocedure Evaluation (Signed)
Anesthesia Evaluation  Patient identified by MRN, date of birth, ID band Patient awake    Reviewed: Allergy & Precautions, H&P , Patient's Chart, lab work & pertinent test results  Airway Mallampati: II TM Distance: >3 FB Neck ROM: full    Dental  (+) Teeth Intact   Pulmonary former smoker,  breath sounds clear to auscultation        Cardiovascular hypertension, On Medications Rhythm:regular Rate:Normal     Neuro/Psych    GI/Hepatic   Endo/Other  diabetesMorbid obesity  Renal/GU      Musculoskeletal   Abdominal   Peds  Hematology   Anesthesia Other Findings       Reproductive/Obstetrics (+) Pregnancy                           Anesthesia Physical Anesthesia Plan  ASA: III  Anesthesia Plan: Epidural   Post-op Pain Management:    Induction:   Airway Management Planned:   Additional Equipment:   Intra-op Plan:   Post-operative Plan:   Informed Consent: I have reviewed the patients History and Physical, chart, labs and discussed the procedure including the risks, benefits and alternatives for the proposed anesthesia with the patient or authorized representative who has indicated his/her understanding and acceptance.   Dental Advisory Given  Plan Discussed with:   Anesthesia Plan Comments: (Labs checked- platelets confirmed with RN in room. Fetal heart tracing, per RN, reported to be stable enough for sitting procedure. Discussed epidural, and patient consents to the procedure:  included risk of possible headache,backache, failed block, allergic reaction, and nerve injury. This patient was asked if she had any questions or concerns before the procedure started. )        Anesthesia Quick Evaluation

## 2013-01-19 NOTE — Progress Notes (Signed)
I was present for the exam and agree with above.  [place], CNM 01/19/2013 2:28 AM

## 2013-01-19 NOTE — Progress Notes (Signed)
Donna Moore is a 32 y.o. G1P0 at [redacted]w[redacted]d admitted for induction of labor due to Hypertension.  Subjective: Pt reports feeling stronger contractions. + GFM.   Objective: BP 125/76  Pulse 88  Temp(Src) 98.3 F (36.8 C) (Oral)  Resp 18  Ht 5\' 7"  (1.702 m)  Wt 117.935 kg (260 lb)  BMI 40.71 kg/m2  LMP 04/20/2012      FHT:  FHR: 135 bpm, variability: moderate,  accelerations:  Present,  decelerations:  Absent UC:   irregular, every 2-3 minutes SVE:   Dilation: 5 Effacement (%): 80 Station: 0 Exam by:: Dr Karle Plumber  Labs: Lab Results  Component Value Date   WBC 8.5 01/18/2013   HGB 12.9 01/18/2013   HCT 36.9 01/18/2013   MCV 88.7 01/18/2013   PLT 221 01/18/2013    Assessment / Plan: Induction of labor due to gestational hypertension,  progressing well on pitocin  Labor: Progressing normally Preeclampsia:  n/a Fetal Wellbeing:  Category I Pain Control:  Fentanyl, epidural upon request I/D:  n/a Anticipated MOD:  NSVD  Donna Moore 01/19/2013, 4:22 AM

## 2013-01-19 NOTE — Anesthesia Postprocedure Evaluation (Signed)
  Anesthesia Post-op Note  Patient: Donna Moore  Procedure(s) Performed: Procedure(s): CESAREAN SECTION (N/A)  Patient is awake, responsive, moving her legs, and has signs of resolution of her numbness. Pain and nausea are reasonably well controlled. Vital signs are stable and clinically acceptable. Oxygen saturation is clinically acceptable. There are no apparent anesthetic complications at this time. Patient is ready for discharge.

## 2013-01-19 NOTE — Progress Notes (Signed)
Donna Moore is a 32 y.o. G1P0 at [redacted]w[redacted]d admitted for induction of labor due to Hypertension.  Subjective: Having moderate pain with contractions. C/o mild headache that has been present all day. No vision changes, n/v, tremor, significant swelling. No other complaints.  Objective: BP 132/80  Pulse 90  Temp(Src) 97.8 F (36.6 C) (Oral)  Resp 18  Ht 5\' 7"  (1.702 m)  Wt 117.935 kg (260 lb)  BMI 40.71 kg/m2  SpO2 96%  LMP 04/20/2012      FHT:  FHR: 125 bpm, variability: moderate,  accelerations:  Present,  decelerations:  Present early UC:   regular, every 2-4 minutes SVE:   Dilation: 6 Effacement (%): 80;90 Station: -1 Exam by:: Dr. Corrin Parker, K. Bing Plume, RN  Labs: Lab Results  Component Value Date   WBC 9.5 01/19/2013   HGB 12.7 01/19/2013   HCT 36.2 01/19/2013   MCV 88.5 01/19/2013   PLT 198 01/19/2013    Assessment / Plan: Induction of labor due to chronic hypertension,  progressing well on pitocin. AROM, IUPC in to monitor for tachysystole.  Labor: Progressing normally place additional cytotec Preeclampsia:  labs stable Fetal Wellbeing:  Category I Pain Control:  Epidural I/D:  n/a Anticipated MOD:  NSVD  Pior, Jearld Lesch 01/19/2013, 2:14 PM  I spoke with and examined patient and agree with resident's note and plan of care.  Tawana Scale, MD OB Fellow 01/19/2013 2:27 PM

## 2013-01-19 NOTE — Transfer of Care (Signed)
Immediate Anesthesia Transfer of Care Note  Patient: Donna Moore  Procedure(s) Performed: Procedure(s): CESAREAN SECTION (N/A)  Patient Location: PACU  Anesthesia Type:Epidural  Level of Consciousness: awake, alert  and oriented  Airway & Oxygen Therapy: Patient Spontanous Breathing  Post-op Assessment: Report given to PACU RN and Post -op Vital signs reviewed and stable  Post vital signs: Reviewed and stable  Complications: No apparent anesthesia complications

## 2013-01-19 NOTE — Progress Notes (Signed)
Donna Moore is a 32 y.o. G1P0 at [redacted]w[redacted]d admitted for induction of labor due to Hypertension.  Subjective: Pt doing well, feeling contractions with tolerable severity. No LOF, bloody show. + GFM.   Objective: BP 143/77  Pulse 99  Temp(Src) 98.1 F (36.7 C) (Oral)  Resp 18  Ht 5\' 7"  (1.702 m)  Wt 117.935 kg (260 lb)  BMI 40.71 kg/m2  LMP 04/20/2012      FHT:  FHR: 125 bpm, variability: moderate,  accelerations:  Present,  decelerations:  Absent UC:   irregular, every 2-3 minutes SVE:   Dilation: 4 Effacement (%): 80 Station: -2 Exam by:: [place] CNM  Labs: Lab Results  Component Value Date   WBC 8.5 01/18/2013   HGB 12.9 01/18/2013   HCT 36.9 01/18/2013   MCV 88.7 01/18/2013   PLT 221 01/18/2013    Assessment / Plan: Induction of labor, starting pitocin 4 hrs after cytotec  Labor: Progressing normally Preeclampsia:  n/a Fetal Wellbeing:  Category I Pain Control:  Fentanyl I/D:  n/a Anticipated MOD:  NSVD  [name] 01/19/2013, 1:48 AM

## 2013-01-19 NOTE — H&P (Signed)
Attestation of Attending Supervision of Advanced Practitioner: Evaluation and management procedures were performed by the PA/NP/CNM/OB Fellow under my supervision/collaboration. Chart reviewed and agree with management and plan.  Nicolis Boody V 01/19/2013 5:55 PM

## 2013-01-19 NOTE — Op Note (Signed)
Donna Moore PROCEDURE DATE: 01/18/2013 - 01/19/2013  PREOPERATIVE DIAGNOSES: Intrauterine pregnancy at  [redacted]w[redacted]d weeks gestation; failure to progress: arrest of dilation  POSTOPERATIVE DIAGNOSES: The same  PROCEDURE: Primary Low Transverse Cesarean Section  SURGEON:  Dr. Emelda Fear  ASSISTANT:  Dr. Ike Bene  INDICATIONS: Donna Moore is a 32 y.o. G1P1001 at [redacted]w[redacted]d here for cesarean section secondary to the indications listed under preoperative diagnoses; please see preoperative note for further details.  The risks of cesarean section were discussed with the patient including but were not limited to: bleeding which may require transfusion or reoperation; infection which may require antibiotics; injury to bowel, bladder, ureters or other surrounding organs; injury to the fetus; need for additional procedures including hysterectomy in the event of a life-threatening hemorrhage; placental abnormalities wth subsequent pregnancies, incisional problems, thromboembolic phenomenon and other postoperative/anesthesia complications.   The patient concurred with the proposed plan, giving informed written consent for the procedure.    FINDINGS:  Viable female infant in cephalic presentation.  Apgars 8 and 9.  Clear amniotic fluid.  Intact placenta, three vessel cord.  Normal uterus, fallopian tubes and ovaries bilaterally.  ANESTHESIA: Epidural INTRAVENOUS FLUIDS: 1000 ml ESTIMATED BLOOD LOSS: 700 ml URINE OUTPUT:  300 ml SPECIMENS: Placenta sent to L&D COMPLICATIONS: None immediate  PROCEDURE IN DETAIL:  The patient preoperatively received intravenous antibiotics and had sequential compression devices applied to her lower extremities.  She was then taken to the operating room where the epidural anesthesia was dosed up to surgical level and was found to be adequate. She was then placed in a dorsal supine position with a leftward tilt, and prepped and draped in a sterile manner.  A foley catheter was placed  into her bladder and attached to constant gravity.  After an adequate timeout was performed, a Pfannenstiel skin incision was made with scalpel and carried through to the underlying layer of fascia. The fascia was incised in the midline, and this incision was extended bilaterally using the Mayo scissors.  Kocher clamps were applied to the superior aspect of the fascial incision and the underlying rectus muscles were dissected off sharply. A similar process was carried out on the inferior aspect of the fascial incision. The rectus muscles were separated in the midline bluntly and the peritoneum was entered bluntly. Attention was turned to the lower uterine segment where a low transverse hysterotomy was made with a scalpel and extended bilaterally bluntly.  The infant was successfully delivered, the cord was clamped and cut and the infant was handed over to awaiting neonatology team. Uterine massage was then administered, and the placenta delivered intact with a three-vessel cord. The uterus was then cleared of clot and debris.  The hysterotomy was closed with 0 chromic in a running locked fashion, and an imbricating layer was also placed with 0 chromic. The pelvis was cleared of all clot and debris. Hemostasis was confirmed on all surfaces.  The peritoneum was reapproximated using 2.0 Vicryl running stitches. The fascia was then closed using 0 Vicryl in a running fashion.  The subcutaneous layer was irrigated.  The skin was closed with a 4-0 Vicryl subcuticular stitch. The patient tolerated the procedure well. Sponge, lap, instrument and needle counts were correct x 2.  She was taken to the recovery room in stable condition.   Donna Scale, MD OB Fellow

## 2013-01-19 NOTE — Progress Notes (Signed)
I was consulted RE: POC and agree with above.  Ziggy Chanthavong, CNM 01/19/2013 7:20 AM  

## 2013-01-19 NOTE — Progress Notes (Signed)
I was consulted RE: POC and agree with above.  [place], CNM 01/19/2013 7:20 AM

## 2013-01-19 NOTE — Anesthesia Procedure Notes (Signed)

## 2013-01-19 NOTE — Progress Notes (Signed)
Donna Moore is a 32 y.o. G1P0 at [redacted]w[redacted]d admitted for induction of labor due to Hypertension.  Subjective: Feeling comfortable after epidural. Feeling occasional pressure. +GFM  Objective: BP 130/78  Pulse 92  Temp(Src) 98.3 F (36.8 C) (Oral)  Resp 18  Ht 5\' 7"  (1.702 m)  Wt 117.935 kg (260 lb)  BMI 40.71 kg/m2  SpO2 97%  LMP 04/20/2012      FHT:  FHR: 140 bpm, variability: moderate,  accelerations:  Present,  decelerations:  Absent UC:   regular, every 2-3 minutes SVE:   Dilation: 5 Effacement (%): 80 Station: 0 Exam by:: Donna Moore  Labs: Lab Results  Component Value Date   WBC 9.5 01/19/2013   HGB 12.7 01/19/2013   HCT 36.2 01/19/2013   MCV 88.5 01/19/2013   PLT 198 01/19/2013    Assessment / Plan: Induction of labor due to gestational hypertension,  progressing well on pitocin  Labor: Progressing on pitocin.  Preeclampsia:  n/a Fetal Wellbeing:  Category I Pain Control:  Epidural I/D:  n/a Anticipated MOD:  NSVD  Donna Moore 01/19/2013, 6:54 AM

## 2013-01-20 ENCOUNTER — Encounter (HOSPITAL_COMMUNITY): Payer: Self-pay | Admitting: Obstetrics and Gynecology

## 2013-01-20 LAB — URINE CULTURE: Colony Count: 30000

## 2013-01-20 LAB — CBC
HCT: 32.4 % — ABNORMAL LOW (ref 36.0–46.0)
MCHC: 34.6 g/dL (ref 30.0–36.0)
MCV: 89.8 fL (ref 78.0–100.0)
Platelets: 157 10*3/uL (ref 150–400)
RBC: 3.61 MIL/uL — ABNORMAL LOW (ref 3.87–5.11)
RDW: 14 % (ref 11.5–15.5)
WBC: 11.6 10*3/uL — ABNORMAL HIGH (ref 4.0–10.5)

## 2013-01-20 MED ORDER — MORPHINE SULFATE 4 MG/ML IJ SOLN
2.0000 mg | Freq: Once | INTRAMUSCULAR | Status: AC
  Administered 2013-01-20: 2 mg via INTRAVENOUS
  Filled 2013-01-20: qty 1

## 2013-01-20 NOTE — Anesthesia Postprocedure Evaluation (Signed)
  Anesthesia Post-op Note  Patient: Donna Moore  Procedure(s) Performed: Procedure(s): CESAREAN SECTION (N/A)  Patient Location: PACU and Mother/Baby  Anesthesia Type:Epidural  Level of Consciousness: awake, alert  and oriented  Airway and Oxygen Therapy: Patient Spontanous Breathing  Post-op Pain: none  Post-op Assessment: Post-op Vital signs reviewed, Patient's Cardiovascular Status Stable, Respiratory Function Stable, No headache, No backache, No residual numbness and No residual motor weakness  Post-op Vital Signs: Reviewed and stable  Complications: No apparent anesthesia complications

## 2013-01-20 NOTE — Progress Notes (Signed)
1 Day Post-Op Procedure(s) (LRB): CESAREAN SECTION (N/A)  Subjective: Donna Moore is a 32 y.o. G1P1001 POD#1. Currently 4/10 incisional pain. Minimal vaginal bleeding (less than her typical period's worth). Minimal ambulation due to pain. Currently using SCD therapy. Foley catheter in place. Tolerating PO intake. No bowel movement yet but has been passing flatus.  Objective:  Filed Vitals:   01/20/13 0525  BP: 107/66  Pulse: 80  Temp: 98.5 F (36.9 C)  Resp: 18   Results for orders placed during the hospital encounter of 01/18/13 (from the past 24 hour(s))  GLUCOSE, CAPILLARY     Status: Abnormal   Collection Time    01/19/13  7:49 AM      Result Value Range   Glucose-Capillary 103 (*) 70 - 99 mg/dL  GLUCOSE, CAPILLARY     Status: Abnormal   Collection Time    01/19/13 11:46 AM      Result Value Range   Glucose-Capillary 124 (*) 70 - 99 mg/dL  GLUCOSE, CAPILLARY     Status: Abnormal   Collection Time    01/19/13  4:39 PM      Result Value Range   Glucose-Capillary 68 (*) 70 - 99 mg/dL   Comment 1 Documented in Chart    GLUCOSE, CAPILLARY     Status: None   Collection Time    01/19/13  5:05 PM      Result Value Range   Glucose-Capillary 73  70 - 99 mg/dL   Comment 1 Documented in Chart     Comment 2 Notify RN    GLUCOSE, CAPILLARY     Status: Abnormal   Collection Time    01/19/13  6:39 PM      Result Value Range   Glucose-Capillary 102 (*) 70 - 99 mg/dL  GLUCOSE, CAPILLARY     Status: None   Collection Time    01/19/13  8:36 PM      Result Value Range   Glucose-Capillary 97  70 - 99 mg/dL  CBC     Status: None   Collection Time    01/19/13  9:40 PM      Result Value Range   WBC 9.4  4.0 - 10.5 K/uL   RBC 4.08  3.87 - 5.11 MIL/uL   Hemoglobin 12.6  12.0 - 15.0 g/dL   HCT 40.9  81.1 - 91.4 %   MCV 88.7  78.0 - 100.0 fL   MCH 30.9  26.0 - 34.0 pg   MCHC 34.8  30.0 - 36.0 g/dL   RDW 78.2  95.6 - 21.3 %   Platelets 186  150 - 400 K/uL  CBC     Status:  Abnormal   Collection Time    01/20/13  6:10 AM      Result Value Range   WBC 11.6 (*) 4.0 - 10.5 K/uL   RBC 3.61 (*) 3.87 - 5.11 MIL/uL   Hemoglobin 11.2 (*) 12.0 - 15.0 g/dL   HCT 08.6 (*) 57.8 - 46.9 %   MCV 89.8  78.0 - 100.0 fL   MCH 31.0  26.0 - 34.0 pg   MCHC 34.6  30.0 - 36.0 g/dL   RDW 62.9  52.8 - 41.3 %   Platelets 157  150 - 400 K/uL    General: alert, cooperative and no distress No signs of DVT. No cords Negative Homan's sign. No significant extremity edema.  Area around dressing and site visible through honey comb/tegaderm dressing is dry with no erythema. No  exquisite tenderness or discharge noted. Otherwise abdomen is soft, non tender.  Assessment: s/p Procedure(s): CESAREAN SECTION (N/A): stable  Offered pain medication but declined.  Plan: Encourage ambulation Discontinue foley catheter. Discontinue SCD therapy once she is more comfortable ambulating.  She desires progesterone only pills as contraceptive. Educated on other contraception methods. She will consider others. She is interested in circumcision for her child and will decide where/when at a later time; cost is primary concern. Plans to breast feed and supplement with formula.   LOS: 2 days    Sherron Monday 01/20/2013, 7:23 AM  I spoke with and examined patient and agree with PA-S's note and plan of care.  Tawana Scale, MD Ob Fellow 01/20/2013 8:43 AM

## 2013-01-20 NOTE — Lactation Note (Signed)
This note was copied from the chart of Donna Moore. Lactation Consultation Note  Patient Name: Donna Moore [id number]'[id number] Date: 01/20/2013 Reason for consult: Initial assessment  Visited with Mom, baby 46 hrs old.  This is Mom's first baby, and she has chosen to pump rather than latch to the breast.  Encouraged continued skin to skin, with explanation on why this was beneficial to baby and her milk supply.  Baby dressed and wrapped in blanket sleeping in crib.  DEBP in room, but unclear if she has been regularly pumping.  Explained the importance of pumping every time the baby feeds.  Encouraged breast massage prior to and after pumping.  Basics reviewed.  Mom had a room full of visitors, so unable to demonstrate manual expression.  Volume parameters handout given and Mom aware of importance of frequent, small feedings.  Showed Mom size of baby's stomach first couple days.   Brochure left in room.  Informed Mom of IP and OP lactation services available to her.  Recommended a strong double electric pump on discharge.  To call for any assistance, otherwise will follow up in am.  01/20/2013, 1:01 PM

## 2013-01-20 NOTE — Progress Notes (Signed)
Ur chart review completed.  

## 2013-01-21 DIAGNOSIS — Z98891 History of uterine scar from previous surgery: Secondary | ICD-10-CM

## 2013-01-21 MED ORDER — DOCUSATE SODIUM 100 MG PO CAPS: 100.0000 mg | ORAL_CAPSULE | Freq: Two times a day (BID) | ORAL | Status: DC | PRN

## 2013-01-21 MED ORDER — IBUPROFEN 600 MG PO TABS: 600.0000 mg | ORAL_TABLET | Freq: Four times a day (QID) | ORAL | Status: DC | PRN

## 2013-01-21 MED ORDER — OXYCODONE-ACETAMINOPHEN 5-325 MG PO TABS: 1.0000 | ORAL_TABLET | ORAL | Status: DC | PRN

## 2013-01-21 NOTE — Lactation Note (Signed)
This note was copied from the chart of Donna Shanessa Hodak. Lactation Consultation Note  Patient Name: Donna Moore ZOXWR'U Date: 01/21/2013 Reason for consult: Follow-up assessment of this 2 day old baby and mom who had initially planned to pump to provide breast milk but now states she wants to only bottle-feed with formula.   Maternal Data    Feeding Feeding Type: Bottle Fed - Formula  LATCH Score/Interventions               N/A - changed to formula/bottle-feeding only       Lactation Tools Discussed/Used   N/A  Consult Status Consult Status: Complete    Lynda Rainwater 01/21/2013, 6:19 PM

## 2013-01-21 NOTE — Discharge Summary (Signed)
Obstetric Discharge Summary Reason for Admission: induction of labor Prenatal Procedures: NST and ultrasound Intrapartum Procedures: cesarean: low cervical, transverse Postpartum Procedures: none Complications-Operative and Postpartum: none Hemoglobin  Date Value Range Status  01/20/2013 11.2* 12.0 - 15.0 g/dL Final     HCT  Date Value Range Status  01/20/2013 32.4* 36.0 - 46.0 % Final    Physical Exam:  General: alert, cooperative and no distress Lochia: appropriate Uterine Fundus: firm Incision: no significant drainage DVT Evaluation: No evidence of DVT seen on physical exam. Negative Homan's sign. No cords or calf tenderness. No significant calf/ankle edema.  Discharge Diagnoses: Term Pregnancy-delivered  Operative Note: PREOPERATIVE DIAGNOSES: Intrauterine pregnancy at [redacted]w[redacted]d weeks gestation; failure to progress: arrest of dilation  POSTOPERATIVE DIAGNOSES: The same  PROCEDURE: Primary Low Transverse Cesarean Section  SURGEON: Dr. Emelda Fear  ASSISTANT: Dr. Ike Bene  INDICATIONS: Donna Moore is a 32 y.o. G1P1001 at [redacted]w[redacted]d here for cesarean section secondary to the indications listed under preoperative diagnoses FINDINGS: Viable female infant in cephalic presentation. Apgars 8 and 9. Clear amniotic fluid. Intact placenta, three vessel cord. Normal uterus, fallopian tubes and ovaries bilaterally.  ANESTHESIA: Epidural  INTRAVENOUS FLUIDS: 1000 ml  ESTIMATED BLOOD LOSS: 700 ml  URINE OUTPUT: 300 ml  SPECIMENS: Placenta sent to L&D  COMPLICATIONS: None immediate  Discharge Information: Date: 01/21/2013 Activity: unrestricted and pelvic rest Diet: routine Medications: Ibuprofen, Colace and Percocet Condition: stable Instructions: refer to practice specific booklet Discharge to: home Follow-up Information   Follow up with [facility]. Schedule an appointment as soon as possible for a visit in 5 weeks.   Contact information:   [address] [place]  [address] [phone]      Newborn Data: Live born female  Birth Weight: 7 lb 3.9 oz (3286 g) APGAR: 8, 9  Home with mother.  Pt to make appointment with pediatrician in [place].    [place], Pharoah Goggins 01/21/2013, 9:43 AM

## 2013-01-22 MED ORDER — OXYCODONE-ACETAMINOPHEN 5-325 MG PO TABS: 1.0000 | ORAL_TABLET | ORAL | Status: DC | PRN

## 2013-01-22 NOTE — Progress Notes (Signed)
Subjective: POD/PPD#3 s/p PLTCS for failure to progress s/p IOL for [facility] & A1DM Patient reports moderate pain controlled with ibuprofen. She reports ambulating, urinating, tolerating PO, & 2 BMs. Denies chest pain, shortness of breath, palpitations, leg swelling/pain.   Objective: Vital signs in last 24 hours: Temp:  [97.6 F (36.4 C)-98.7 F (37.1 C)] 97.6 F (36.4 C) (12/06 0555) Pulse Rate:  [86-111] 86 (12/06 0555) Resp:  [18] 18 (12/06 0555) BP: (123-125)/(70-82) 123/82 mmHg (12/06 0555)  Physical Exam:  General: alert, cooperative and no distress Lochia: appropriate Uterine Fundus: firm Incision: healing well, no significant drainage, no dehiscence, no significant erythema DVT Evaluation: No evidence of DVT seen on physical exam. Negative Homan's sign.   Recent Labs  01/19/13 2140 01/20/13 0610  HGB 12.6 11.2*  HCT 36.2 32.4*    Assessment/Plan: 32yo G1 now P1001 POD/PPD#3 s/p PLTCS for failure to progress s/p IOL for [facility] & A1DM doing well. BP, CBG wnl & stable postpartum Undecided on [facility], will consult with OB/Gyn outpatient on final decision Breast & Bottle-feeding Desires circumcision outpatient A+, rubella immune, TDaP given Discharge home with standard precautions and return to clinic in 4-6 weeks.  Donna Moore, Jearld Lesch 01/22/2013, 7:48 AM

## 2013-01-22 NOTE — Discharge Summary (Addendum)
I spoke with and examined patient and agree with resident's note and plan of care.  Eating, drinking, voiding, ambulating well.  +flatus.  Lochia and pain wnl.  Denies dizziness, lightheadedness, or sob. No complaints.   Hospital Course:  Donna Moore was admitted on 01/18/13 @ [redacted]w[redacted]d for IOL d/t CHTN and A1DM w/ SVE of 0/th/posterior/-3. After 1 dose of vaginal cytotec SVE 4/80/-2/anterior. Despite pitocin, AROM, and adequate MVUs via IUPC for >6hr, her cervix never progressed past 5.5/90/-1. 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 Baby weighed 7lb 3.9oz, Apgars 8/9. Postpartum/postoperative course has been uncomplicated.   Cheral Marker, CNM, [facility] 01/22/2013 10:49 AM

## 2013-01-22 NOTE — Progress Notes (Signed)
I spoke with and examined patient and agree with resident's note and plan of care.  Cheral Marker, CNM, WHNP-BC 01/22/2013 11:01 AM

## 2013-01-22 NOTE — Discharge Summary (Signed)
Obstetric Discharge Summary Reason for Admission: induction of labor Prenatal Procedures: none Intrapartum Procedures: cesarean: low cervical, transverse Postpartum Procedures: none Complications-Operative and Postpartum: none Hemoglobin  Date Value Range Status  01/20/2013 11.2* 12.0 - 15.0 g/dL Final     HCT  Date Value Range Status  01/20/2013 32.4* 36.0 - 46.0 % Final    Physical Exam:  General: alert, cooperative and no distress Lochia: appropriate Uterine Fundus: firm Incision: healing well, no significant drainage, no dehiscence, no significant erythema DVT Evaluation: No evidence of DVT seen on physical exam. Negative Homan's sign. No significant calf/ankle edema.  Discharge Diagnoses: Term Pregnancy-delivered  Discharge Information: Date: 01/22/2013 Activity: unrestricted Diet: routine Medications: PNV Condition: stable Instructions: refer to practice specific booklet Discharge to: home Follow-up Information   Follow up with [facility]. Schedule an appointment as soon as possible for a visit in 5 weeks.   Contact information:   [address] [place] [address] [phone]      Newborn Data: Live born female  Birth Weight: 7 lb 3.9 oz (3286 g) APGAR: 8, 9  Home with mother.  Philopateer Strine, Jearld Lesch 01/22/2013, 8:42 AM

## 2013-01-26 ENCOUNTER — Encounter: Payer: Medicaid Other | Admitting: Adult Health

## 2013-01-26 ENCOUNTER — Encounter: Payer: Self-pay | Admitting: Adult Health

## 2013-01-26 ENCOUNTER — Ambulatory Visit (INDEPENDENT_AMBULATORY_CARE_PROVIDER_SITE_OTHER): Payer: Medicaid Other | Admitting: Adult Health

## 2013-01-26 VITALS — BP 142/90 | Ht 67.0 in | Wt 253.0 lb

## 2013-01-26 DIAGNOSIS — O1002 Pre-existing essential hypertension complicating childbirth: Secondary | ICD-10-CM | POA: Insufficient documentation

## 2013-01-26 DIAGNOSIS — O10019 Pre-existing essential hypertension complicating pregnancy, unspecified trimester: Secondary | ICD-10-CM

## 2013-01-26 DIAGNOSIS — T8149XA Infection following a procedure, other surgical site, initial encounter: Secondary | ICD-10-CM | POA: Insufficient documentation

## 2013-01-26 HISTORY — DX: Pre-existing essential hypertension complicating childbirth: O10.02

## 2013-01-26 HISTORY — DX: Infection following a procedure, other surgical site, initial encounter: T81.49XA

## 2013-01-26 LAB — COMPREHENSIVE METABOLIC PANEL
Albumin: 3.3 g/dL — ABNORMAL LOW (ref 3.5–5.2)
Alkaline Phosphatase: 101 U/L (ref 39–117)
BUN: 10 mg/dL (ref 6–23)
Calcium: 9.1 mg/dL (ref 8.4–10.5)
Chloride: 105 mEq/L (ref 96–112)
Creat: 0.78 mg/dL (ref 0.50–1.10)
Glucose, Bld: 78 mg/dL (ref 70–99)
Potassium: 4.4 mEq/L (ref 3.5–5.3)
Total Protein: 6.2 g/dL (ref 6.0–8.3)

## 2013-01-26 LAB — CBC
HCT: 36.1 % (ref 36.0–46.0)
Hemoglobin: 12.3 g/dL (ref 12.0–15.0)
MCHC: 34.1 g/dL (ref 30.0–36.0)
WBC: 5.6 10*3/uL (ref 4.0–10.5)

## 2013-01-26 MED ORDER — CEPHALEXIN 500 MG PO CAPS: 500.0000 mg | ORAL_CAPSULE | Freq: Four times a day (QID) | ORAL | Status: DC

## 2013-01-26 MED ORDER — HYDROCHLOROTHIAZIDE 25 MG PO TABS: 25.0000 mg | ORAL_TABLET | Freq: Every day | ORAL | Status: DC

## 2013-01-26 MED ORDER — OXYCODONE-ACETAMINOPHEN 5-325 MG PO TABS: 1.0000 | ORAL_TABLET | ORAL | Status: DC | PRN

## 2013-01-26 NOTE — Patient Instructions (Addendum)
Increase rest Take keflex and the HCTZ  follow up in 2 days

## 2013-01-26 NOTE — Progress Notes (Signed)
Subjective:     Patient ID: Donna Moore, female   DOB: 08-Jun-1980, 32 y.o.   MRN: [id number]  HPI Donna Moore is a 32 year old black female who had a C section 12/3, in for incision check.She complains of swelling in feet and ankles, she denies headache, blurred vision, or right upper quadrant pain.  Review of Systems See HPI Reviewed past medical,surgical, social and family history. Reviewed medications and allergies.     Objective:   Physical Exam BP 142/90  Ht 5\' 7"  (1.702 m)  Wt 253 lb (114.76 kg)  BMI 39.62 kg/m2  LMP 04/20/2012  Breastfeeding? YesHoney comb dressing removed, has steri strips in place there is some redness right top side of incision and it is tender, no odor, no right upper quadrant tenderness, DTRs 1-2+ no clonus.    Assessment:     Hypertension Incisional infection    Plan:    Rx keflex 500mg  1 qid x 7 days # 28 no refills,   Refilled percocet 5/325 mg #20 no refills Rx HCTZ 25 mg #30 1 daily no refills Check CBC, CMP follow up in 2 days Increase rest and water and pumping,  Call if any fever, headache or doesn't feel well

## 2013-01-27 NOTE — Progress Notes (Signed)
This encounter was created in error - please disregard.

## 2013-01-28 ENCOUNTER — Ambulatory Visit (INDEPENDENT_AMBULATORY_CARE_PROVIDER_SITE_OTHER): Payer: Medicaid Other | Admitting: Adult Health

## 2013-01-28 ENCOUNTER — Encounter: Payer: Self-pay | Admitting: Adult Health

## 2013-01-28 VITALS — BP 138/90 | Ht 67.0 in | Wt 242.0 lb

## 2013-01-28 DIAGNOSIS — K5900 Constipation, unspecified: Secondary | ICD-10-CM

## 2013-01-28 DIAGNOSIS — O1002 Pre-existing essential hypertension complicating childbirth: Secondary | ICD-10-CM

## 2013-01-28 HISTORY — DX: Constipation, unspecified: K59.00

## 2013-01-28 NOTE — Patient Instructions (Signed)
Continue medsConstipation, Adult Constipation is when a person has fewer than 3 bowel movements a week; has difficulty having a bowel movement; or has stools that are dry, hard, or larger than normal. As people grow older, constipation is more common. If you try to fix constipation with medicines that make you have a bowel movement (laxatives), the problem may get worse. Long-term laxative use may cause the muscles of the colon to become weak. A low-fiber diet, not taking in enough fluids, and taking certain medicines may make constipation worse. CAUSES   Certain medicines, such as antidepressants, pain medicine, iron supplements, antacids, and water pills.   Certain diseases, such as diabetes, irritable bowel syndrome (IBS), thyroid disease, or depression.   Not drinking enough water.   Not eating enough fiber-rich foods.   Stress or travel.  Lack of physical activity or exercise.  Not going to the restroom when there is the urge to have a bowel movement.  Ignoring the urge to have a bowel movement.  Using laxatives too much. SYMPTOMS   Having fewer than 3 bowel movements a week.   Straining to have a bowel movement.   Having hard, dry, or larger than normal stools.   Feeling full or bloated.   Pain in the lower abdomen.  Not feeling relief after having a bowel movement. DIAGNOSIS  Your caregiver will take a medical history and perform a physical exam. Further testing may be done for severe constipation. Some tests may include:   A barium enema X-ray to examine your rectum, colon, and sometimes, your small intestine.  A sigmoidoscopy to examine your lower colon.  A colonoscopy to examine your entire colon. TREATMENT  Treatment will depend on the severity of your constipation and what is causing it. Some dietary treatments include drinking more fluids and eating more fiber-rich foods. Lifestyle treatments may include regular exercise. If these diet and lifestyle  recommendations do not help, your caregiver may recommend taking over-the-counter laxative medicines to help you have bowel movements. Prescription medicines may be prescribed if over-the-counter medicines do not work.  HOME CARE INSTRUCTIONS   Increase dietary fiber in your diet, such as fruits, vegetables, whole grains, and beans. Limit high-fat and processed sugars in your diet, such as [place] fries, hamburgers, cookies, candies, and soda.   A fiber supplement may be added to your diet if you cannot get enough fiber from foods.   Drink enough fluids to keep your urine clear or pale yellow.   Exercise regularly or as directed by your caregiver.   Go to the restroom when you have the urge to go. Do not hold it.  Only take medicines as directed by your caregiver. Do not take other medicines for constipation without talking to your caregiver first. SEEK IMMEDIATE MEDICAL CARE IF:   You have bright red blood in your stool.   Your constipation lasts for more than 4 days or gets worse.   You have abdominal or rectal pain.   You have thin, pencil-like stools.  You have unexplained weight loss. MAKE SURE YOU:   Understand these instructions.  Will watch your condition.  Will get help right away if you are not doing well or get worse. Document Released: 11/02/2003 Document Revised: 04/28/2011 Document Reviewed: 01/07/2011 [facility] Patient Information 2014 [place], [place]. Try glycerine supp and prune juice or senokot Follow up in 4 days

## 2013-01-28 NOTE — Progress Notes (Signed)
Subjective:     Patient ID: Donna Moore, female   DOB: 04-Jan-1981, 32 y.o.   MRN: [id number]  HPI Donna Moore is back for BP check and incisional check.Complains of constipation.  Review of Systems See HPI Reviewed past medical,surgical, social and family history. Reviewed medications and allergies.     Objective:   Physical Exam BP 138/90  Ht 5\' 7"  (1.702 m)  Wt 242 lb (109.77 kg)  BMI 37.89 kg/m2  LMP 04/20/2012  Breastfeeding? Yesfeeling better, has lost 11 lbs,reviewed labs, take prenatal vitamins,incsion less red still tender, steri strips on, less swelling in feet and ankles    Assessment:     Hypertension Constipation     Plan:     Continue meds Try glycerine supp, prune juice or senokot Keep pushing fluids and pumping and can try fenugreek   Follow up in 4 days for BP check

## 2013-01-31 ENCOUNTER — Telehealth: Payer: Self-pay | Admitting: *Deleted

## 2013-01-31 NOTE — Telephone Encounter (Signed)
Spoke with pt. Having "throbbing pain" from c section. On Percocet for pain. Pain is not any worse than has been. I let pt know recovery time is longer after a c section. Pt has a scheduled appt tomorrow. Offered appt for today but pt wanted to wait until tomorrow. Advised to call back today if had any problems. Pt voiced understanding. JSY

## 2013-02-01 ENCOUNTER — Encounter: Payer: Medicaid Other | Admitting: Adult Health

## 2013-02-01 ENCOUNTER — Ambulatory Visit (INDEPENDENT_AMBULATORY_CARE_PROVIDER_SITE_OTHER): Payer: Medicaid Other | Admitting: Adult Health

## 2013-02-01 ENCOUNTER — Encounter: Payer: Self-pay | Admitting: Adult Health

## 2013-02-01 VITALS — BP 136/90 | Ht 67.0 in | Wt 236.0 lb

## 2013-02-01 DIAGNOSIS — O1002 Pre-existing essential hypertension complicating childbirth: Secondary | ICD-10-CM

## 2013-02-01 DIAGNOSIS — IMO0001 Reserved for inherently not codable concepts without codable children: Secondary | ICD-10-CM

## 2013-02-01 NOTE — Patient Instructions (Signed)
Follow up in 3 weeks for postpartum check

## 2013-02-01 NOTE — Progress Notes (Signed)
Subjective:     Patient ID: Donna Moore, female   DOB: [birth year], 32 y.o.   MRN: [id number]  HPI Donna Moore is in for a BP check and has some pain in incision still, has 2 more days of keflex. Bowels working better with prune juice.  Review of Systems See HPI Reviewed past medical,surgical, social and family history. Reviewed medications and allergies.     Objective:   Physical Exam BP 136/90  Ht 5\' 7"  (1.702 m)  Wt 236 lb (107.049 kg)  BMI 36.95 kg/m2  LMP 04/20/2012  Breastfeeding? Yes   Steri strips removed, edges together, no redness separation or odor noted.Dr Despina Hidden in for co exam, feels pain may be muscle related.  Assessment:     Hypertension     Plan:     Continue meds Follow up in 3 weeks for postpartum check   Ok to take motrin every 8 hours prn pain Continue to increase pumping and fluids

## 2013-02-03 ENCOUNTER — Telehealth: Payer: Self-pay | Admitting: *Deleted

## 2013-02-03 ENCOUNTER — Encounter: Payer: Self-pay | Admitting: Adult Health

## 2013-02-03 ENCOUNTER — Ambulatory Visit (INDEPENDENT_AMBULATORY_CARE_PROVIDER_SITE_OTHER): Payer: Medicaid Other | Admitting: Adult Health

## 2013-02-03 VITALS — BP 130/92 | Ht 67.0 in | Wt 234.0 lb

## 2013-02-03 DIAGNOSIS — O26899 Other specified pregnancy related conditions, unspecified trimester: Secondary | ICD-10-CM

## 2013-02-03 DIAGNOSIS — IMO0001 Reserved for inherently not codable concepts without codable children: Secondary | ICD-10-CM

## 2013-02-03 DIAGNOSIS — L7682 Other postprocedural complications of skin and subcutaneous tissue: Secondary | ICD-10-CM

## 2013-02-03 MED ORDER — OXYCODONE-ACETAMINOPHEN 5-325 MG PO TABS: 1.0000 | ORAL_TABLET | ORAL | Status: DC | PRN

## 2013-02-03 NOTE — Telephone Encounter (Signed)
Pt states incision site is "opening some." Pt coming in this afternoon to be evaluated.

## 2013-02-03 NOTE — Progress Notes (Signed)
Subjective:     Patient ID: Donna Moore, female   DOB: 05/09/1980, 32 y.o.   MRN: [id number]  HPI Donna Moore is back saying incision is opening up and has pain.  Review of Systems See HPI Reviewed past medical,surgical, social and family history. Reviewed medications and allergies.     Objective:   Physical Exam BP 130/92  Ht 5\' 7"  (1.702 m)  Wt 234 lb (106.142 kg)  BMI 36.64 kg/m2  Breastfeeding? Yes   Incision is closed but can see pink top left where edges slightly miss aligned, still complains of pain at incision,which is probably muscle related as discussed by Dr Despina Hidden last visit. Got mirror out and showed her the incision, and applied  Steri strips for her comfort.  Assessment:     Incisional pain    Plan:     Rx perocet 5/325 mg #30 no refills, try to use Motrin when pain not severe Follow up as scheduled or before if needed

## 2013-02-03 NOTE — Patient Instructions (Signed)
Take percocet if pain severe and motrin the other times Follow up as scheduled

## 2013-02-15 ENCOUNTER — Ambulatory Visit (INDEPENDENT_AMBULATORY_CARE_PROVIDER_SITE_OTHER): Payer: Medicaid Other | Admitting: Obstetrics & Gynecology

## 2013-02-15 ENCOUNTER — Encounter: Payer: Self-pay | Admitting: Obstetrics & Gynecology

## 2013-02-15 VITALS — BP 130/82 | Ht 67.0 in | Wt 230.5 lb

## 2013-02-15 DIAGNOSIS — T8189XD Other complications of procedures, not elsewhere classified, subsequent encounter: Secondary | ICD-10-CM

## 2013-02-15 DIAGNOSIS — O26899 Other specified pregnancy related conditions, unspecified trimester: Secondary | ICD-10-CM

## 2013-02-15 DIAGNOSIS — IMO0001 Reserved for inherently not codable concepts without codable children: Secondary | ICD-10-CM

## 2013-02-15 NOTE — Progress Notes (Signed)
Patient ID: Donna Moore, female   DOB: 1980/02/25, 32 y.o.   MRN: [id number] Blood pressure 130/82, height 5\' 7"  (1.702 m), weight 230 lb 8 oz (104.554 kg), currently breastfeeding.  Incision clean moist some serous drainage Needs to keep dryer Gentian violet placed  Encouraged to keep dry  Follow up 3 weeks for pp exam  Past Medical History  Diagnosis Date  . Heart murmur   . Pregnancy 06/21/2012  . Gestational diabetes   . Pregnancy induced hypertension   . Benign essential hypertension with delivery 01/26/2013  . Incisional infection 01/26/2013  . Constipation 01/28/2013    Past Surgical History  Procedure Laterality Date  . Cyst drained      Emergency surg, and in hospital for 1 week.  . Cesarean section N/A 01/19/2013    Procedure: CESAREAN SECTION;  Surgeon: Tilda Burrow, MD;  Location: [facility];  Service: Obstetrics;  Laterality: N/A;    OB History   Grav Para Term Preterm Abortions TAB SAB Ect Mult Living   1 1 1       1       No Known Allergies  History   Social History  . Marital Status: Single    Spouse Name: N/A    Number of Children: N/A  . Years of Education: N/A   Social History Main Topics  . Smoking status: Former Smoker -- 1 years    Types: Cigarettes    Quit date: 01/11/2012  . Smokeless tobacco: Never Used  . Alcohol Use: No  . Drug Use: No  . Sexual Activity: Not Currently    Birth Control/ Protection: None   Other Topics Concern  . None   Social History Narrative  . None    Family History  Problem Relation Age of Onset  . Hypertension Mother   . Diabetes Mother   . Hypertension Father   . Diabetes Brother

## 2013-02-17 ENCOUNTER — Inpatient Hospital Stay (HOSPITAL_COMMUNITY)
Admission: AD | Admit: 2013-02-17 | Payer: Medicaid Other | Source: Ambulatory Visit | Admitting: Obstetrics & Gynecology

## 2013-03-01 ENCOUNTER — Ambulatory Visit (INDEPENDENT_AMBULATORY_CARE_PROVIDER_SITE_OTHER): Payer: Medicaid Other | Admitting: Obstetrics & Gynecology

## 2013-03-01 ENCOUNTER — Encounter: Payer: Self-pay | Admitting: Obstetrics & Gynecology

## 2013-03-01 MED ORDER — DESOGESTREL-ETHINYL ESTRADIOL 0.15-0.02/0.01 MG (21/5) PO TABS: 1.0000 | ORAL_TABLET | Freq: Every day | ORAL | Status: DC

## 2013-03-01 NOTE — Progress Notes (Signed)
Patient ID: Donna Moore, female   DOB: 1980/08/22, 33 y.o.   MRN: [id number] Post partum visit  Delivered by Caesarean section on 01/19/2013, induced due to chronic hypertension  Blood pressure 122/80, height 5\' 7"  (1.702 m), weight 232 lb (105.235 kg).  No complaints  Exam Some granulation  Tissue, used silver nitrate Also placed gentian violet  Uterus normal involution Adnexa negative  Begin desogestrel, EE 20 mirograms

## 2013-04-29 ENCOUNTER — Ambulatory Visit: Payer: Medicaid Other | Admitting: Obstetrics & Gynecology

## 2013-11-17 ENCOUNTER — Other Ambulatory Visit: Payer: Medicaid Other | Admitting: Obstetrics & Gynecology

## 2013-11-22 ENCOUNTER — Ambulatory Visit (INDEPENDENT_AMBULATORY_CARE_PROVIDER_SITE_OTHER): Payer: Medicaid Other | Admitting: Obstetrics & Gynecology

## 2013-11-22 ENCOUNTER — Other Ambulatory Visit (HOSPITAL_COMMUNITY)
Admission: RE | Admit: 2013-11-22 | Discharge: 2013-11-22 | Disposition: A | Discharge status: Home / Self Care | Payer: Medicaid Other | Source: Ambulatory Visit | Attending: Obstetrics & Gynecology | Admitting: Obstetrics & Gynecology

## 2013-11-22 ENCOUNTER — Encounter: Payer: Self-pay | Admitting: Obstetrics & Gynecology

## 2013-11-22 VITALS — BP 120/80 | Ht 67.0 in | Wt 219.0 lb

## 2013-11-22 DIAGNOSIS — Z1151 Encounter for screening for human papillomavirus (HPV): Secondary | ICD-10-CM | POA: Diagnosis present

## 2013-11-22 DIAGNOSIS — Z0000 Encounter for general adult medical examination without abnormal findings: Secondary | ICD-10-CM

## 2013-11-22 DIAGNOSIS — Z01419 Encounter for gynecological examination (general) (routine) without abnormal findings: Secondary | ICD-10-CM | POA: Diagnosis present

## 2013-11-22 MED ORDER — DESOGESTREL-ETHINYL ESTRADIOL 0.15-0.02/0.01 MG (21/5) PO TABS: 1.0000 | ORAL_TABLET | Freq: Every day | ORAL | Status: DC

## 2013-11-22 NOTE — Progress Notes (Signed)
Patient ID: Donna Moore, female   DOB: 09-16-1980, 33 y.o.   MRN: [id number] Subjective:     Donna Moore is a 33 y.o. female here for a routine exam.  Patient's last menstrual period was 11/14/2013. G1P1001 Birth Control Method:  OCP + condoms Menstrual Calendar(currently): regular  Current complaints: none.   Current acute medical issues:  none   Recent Gynecologic History Patient's last menstrual period was 11/14/2013. Last Pap: 2014,  normal Last mammogram: ,    Past Medical History  Diagnosis Date  . Heart murmur   . Pregnancy 06/21/2012  . Gestational diabetes   . Pregnancy induced hypertension   . Benign essential hypertension with delivery 01/26/2013  . Incisional infection 01/26/2013  . Constipation 01/28/2013    Past Surgical History  Procedure Laterality Date  . Cyst drained      Emergency surg, and in hospital for 1 week.  . Cesarean section N/A 01/19/2013    Procedure: CESAREAN SECTION;  Surgeon: Tilda BurrowJohn V Ferguson, MD;  Location: [facility];  Service: Obstetrics;  Laterality: N/A;    OB History   Grav Para Term Preterm Abortions TAB SAB Ect Mult Living   1 1 1       1       History   Social History  . Marital Status: Single    Spouse Name: N/A    Number of Children: N/A  . Years of Education: N/A   Social History Main Topics  . Smoking status: Former Smoker -- 1 years    Types: Cigarettes    Quit date: 01/11/2012  . Smokeless tobacco: Never Used  . Alcohol Use: No  . Drug Use: No  . Sexual Activity: Not Currently    Birth Control/ Protection: None   Other Topics Concern  . None   Social History Narrative  . None    Family History  Problem Relation Age of Onset  . Hypertension Mother   . Diabetes Mother   . Hypertension Father   . Diabetes Brother     Current outpatient prescriptions:cephALEXin (KEFLEX) 500 MG capsule, Take 1 capsule (500 mg total) by mouth 4 (four) times daily., Disp: 28 capsule, Rfl: 0;  desogestrel-ethinyl  estradiol (KARIVA,AZURETTE,MIRCETTE) 0.15-0.02/0.01 MG (21/5) tablet, Take 1 tablet by mouth daily., Disp: 1 Package, Rfl: 11;  docusate sodium (COLACE) 100 MG capsule, Take 1 capsule (100 mg total) by mouth 2 (two) times daily as needed., Disp: 30 capsule, Rfl: 2 hydrochlorothiazide (HYDRODIURIL) 25 MG tablet, Take 1 tablet (25 mg total) by mouth daily., Disp: 30 tablet, Rfl: 0;  ibuprofen (ADVIL,MOTRIN) 600 MG tablet, Take 1 tablet (600 mg total) by mouth every 6 (six) hours as needed for mild pain., Disp: 30 tablet, Rfl: 0 oxyCODONE-acetaminophen (PERCOCET/ROXICET) 5-325 MG per tablet, Take 1-2 tablets by mouth every 4 (four) hours as needed for severe pain (moderate - severe pain)., Disp: 30 tablet, Rfl: 0;  Prenatal Vit-Fe Fumarate-FA (MULTIVITAMIN-PRENATAL) 27-0.8 MG TABS tablet, Take 1 tablet by mouth daily at 12 noon., Disp: , Rfl:   Review of Systems  Review of Systems  Constitutional: Negative for fever, chills, weight loss, malaise/fatigue and diaphoresis.  HENT: Negative for hearing loss, ear pain, nosebleeds, congestion, sore throat, neck pain, tinnitus and ear discharge.   Eyes: Negative for blurred vision, double vision, photophobia, pain, discharge and redness.  Respiratory: Negative for cough, hemoptysis, sputum production, shortness of breath, wheezing and stridor.   Cardiovascular: Negative for chest pain, palpitations, orthopnea, claudication, leg swelling and PND.  Gastrointestinal: negative  for abdominal pain. Negative for heartburn, nausea, vomiting, diarrhea, constipation, blood in stool and melena.  Genitourinary: Negative for dysuria, urgency, frequency, hematuria and flank pain.  Musculoskeletal: Negative for myalgias, back pain, joint pain and falls.  Skin: Negative for itching and rash.  Neurological: Negative for dizziness, tingling, tremors, sensory change, speech change, focal weakness, seizures, loss of consciousness, weakness and headaches.  Endo/Heme/Allergies:  Negative for environmental allergies and polydipsia. Does not bruise/bleed easily.  Psychiatric/Behavioral: Negative for depression, suicidal ideas, hallucinations, memory loss and substance abuse. The patient is not nervous/anxious and does not have insomnia.        Objective:  Blood pressure 120/80, height 5\' 7"  (1.702 m), weight 219 lb (99.338 kg), last menstrual period 11/14/2013.   Physical Exam  Vitals reviewed. Constitutional: She is oriented to person, place, and time. She appears well-developed and well-nourished.  HENT:  Head: Normocephalic and atraumatic.        Right Ear: External ear normal.  Left Ear: External ear normal.  Nose: Nose normal.  Mouth/Throat: Oropharynx is clear and moist.  Eyes: Conjunctivae and EOM are normal. Pupils are equal, round, and reactive to light. Right eye exhibits no discharge. Left eye exhibits no discharge. No scleral icterus.  Neck: Normal range of motion. Neck supple. No tracheal deviation present. No thyromegaly present.  Cardiovascular: Normal rate, regular rhythm, normal heart sounds and intact distal pulses.  Exam reveals no gallop and no friction rub.   No murmur heard. Respiratory: Effort normal and breath sounds normal. No respiratory distress. She has no wheezes. She has no rales. She exhibits no tenderness.  GI: Soft. Bowel sounds are normal. She exhibits no distension and no mass. There is no tenderness. There is no rebound and no guarding.  Genitourinary:  Breasts no masses skin changes or nipple changes bilaterally      Vulva is normal without lesions Vagina is pink moist without discharge Cervix normal in appearance and pap is done Uterus is normal size shape and contour Adnexa is negative with normal sized ovaries   Musculoskeletal: Normal range of motion. She exhibits no edema and no tenderness.  Neurological: She is alert and oriented to person, place, and time. She has normal reflexes. She displays normal reflexes. No  cranial nerve deficit. She exhibits normal muscle tone. Coordination normal.  Skin: Skin is warm and dry. No rash noted. No erythema. No pallor.  Psychiatric: She has a normal mood and affect. Her behavior is normal. Judgment and thought content normal.       Assessment:    Healthy female exam.    Plan:    Contraception: OCP (estrogen/progesterone). Follow up in: 1 year.

## 2013-11-24 LAB — CYTOLOGY - PAP

## 2013-12-19 ENCOUNTER — Encounter: Payer: Self-pay | Admitting: Obstetrics & Gynecology

## 2014-08-14 ENCOUNTER — Other Ambulatory Visit: Payer: Self-pay

## 2015-08-22 ENCOUNTER — Ambulatory Visit (INDEPENDENT_AMBULATORY_CARE_PROVIDER_SITE_OTHER): Payer: Medicaid Other | Admitting: Adult Health

## 2015-08-22 ENCOUNTER — Other Ambulatory Visit (HOSPITAL_COMMUNITY)
Admission: RE | Admit: 2015-08-22 | Discharge: 2015-08-22 | Disposition: A | Discharge status: Home / Self Care | Payer: Medicaid Other | Source: Ambulatory Visit | Attending: Adult Health | Admitting: Adult Health

## 2015-08-22 ENCOUNTER — Encounter: Payer: Self-pay | Admitting: Adult Health

## 2015-08-22 VITALS — BP 132/80 | HR 92 | Ht 67.0 in | Wt 209.0 lb

## 2015-08-22 DIAGNOSIS — Z0000 Encounter for general adult medical examination without abnormal findings: Secondary | ICD-10-CM | POA: Diagnosis not present

## 2015-08-22 DIAGNOSIS — Q828 Other specified congenital malformations of skin: Secondary | ICD-10-CM

## 2015-08-22 DIAGNOSIS — Z01419 Encounter for gynecological examination (general) (routine) without abnormal findings: Secondary | ICD-10-CM | POA: Insufficient documentation

## 2015-08-22 DIAGNOSIS — Z319 Encounter for procreative management, unspecified: Secondary | ICD-10-CM | POA: Insufficient documentation

## 2015-08-22 DIAGNOSIS — Z1151 Encounter for screening for human papillomavirus (HPV): Secondary | ICD-10-CM | POA: Diagnosis present

## 2015-08-22 DIAGNOSIS — Z01411 Encounter for gynecological examination (general) (routine) with abnormal findings: Secondary | ICD-10-CM | POA: Diagnosis not present

## 2015-08-22 DIAGNOSIS — Z124 Encounter for screening for malignant neoplasm of cervix: Secondary | ICD-10-CM

## 2015-08-22 DIAGNOSIS — Z113 Encounter for screening for infections with a predominantly sexual mode of transmission: Secondary | ICD-10-CM | POA: Insufficient documentation

## 2015-08-22 DIAGNOSIS — N946 Dysmenorrhea, unspecified: Secondary | ICD-10-CM | POA: Diagnosis not present

## 2015-08-22 HISTORY — DX: Encounter for procreative management, unspecified: Z31.9

## 2015-08-22 HISTORY — DX: Other specified congenital malformations of skin: Q82.8

## 2015-08-22 HISTORY — DX: Dysmenorrhea, unspecified: N94.6

## 2015-08-22 MED ORDER — PRENATAL PLUS 27-1 MG PO TABS: 1.0000 | ORAL_TABLET | Freq: Every day | ORAL | Status: DC

## 2015-08-22 MED ORDER — IBUPROFEN 800 MG PO TABS: 800.0000 mg | ORAL_TABLET | Freq: Three times a day (TID) | ORAL | Status: DC | PRN

## 2015-08-22 NOTE — Progress Notes (Signed)
Patient ID: Donna Moore Haile, female   DOB: 26-Mar-1980, 35 y.o.   MRN: [id number] History of Present Illness: Donna Moore is a 35 year old black female, married, G1P1 in for a well woman gyn exam and pap and complains of bad period cramps for first 2 days and periods heavy those days, too, and she would like to be pregnant soon, has 35 year old son. PCP is Dr Polly CobiaHasanji.   Current Medications, Allergies, Past Medical History, Past Surgical History, Family History and Social History were reviewed in [facility] electronic [facility] record.     Review of Systems: Patient denies any headaches, hearing loss, fatigue, blurred vision, shortness of breath, chest pain, problems with bowel movements, urination, or intercourse. No joint pain or mood swings. See HPI for positives.   Physical Exam:BP 132/80 mmHg  Pulse 92  Ht 5\' 7"  (1.702 m)  Wt 209 lb (94.802 kg)  BMI 32.73 kg/m2  LMP 07/24/2015 General:  Well developed, well nourished, no acute distress Skin:  Warm and dry Neck:  Midline trachea, normal thyroid, good ROM, no lymphadenopathy Lungs; Clear to auscultation bilaterally Breast:  No dominant palpable mass, retraction, or nipple discharge Cardiovascular: Regular rate and rhythm Abdomen:  Soft, non tender, no hepatosplenomegaly Pelvic:  External genitalia is normal in appearance, she has 5 skin tags on left peri area and she says her panties pull them, they are about 1-1.5 cm.  The vagina is normal in appearance. Urethra has no lesions or masses. The cervix is bulbous,pap with GC/CHL performed.  Uterus is felt to be normal size, shape, and contour.  No adnexal masses or tenderness noted.Bladder is non tender, no masses felt. Extremities/musculoskeletal:  No swelling or varicosities noted, no clubbing or cyanosis Psych:  No mood changes, alert and cooperative,seems happy Discussed timing of sex.  Impression: Well woman gyn exam and pap Period cramps Desires pregnancy Skin tags      Plan: Rx motrin 800 mg #60 take 1 every 8 hours prn cramps with 1 refill, start before period and increase diary Rx prenatal plus #30 take 1 daily with 11 refills Return in 2 weeks for skin tag removal Physical in 1 year, pap in 3 if normal Check CBC,CMP,TSH and lipids,A1c and vitamin D  Can continue BP meds for now, will change when pregnant

## 2015-08-22 NOTE — Patient Instructions (Addendum)
Physical in 1 year pap in 3 if normal Take prenatal vitamin daily  Dicussed timing of sex  Return in 2 weeks for skin tag removal  Take motrin before period starts and increase diary

## 2015-08-23 LAB — LIPID PANEL
Chol/HDL Ratio: 2.9 ratio units (ref 0.0–4.4)
Cholesterol, Total: 183 mg/dL (ref 100–199)
HDL: 64 mg/dL (ref >39)
LDL Calculated: 103 mg/dL — ABNORMAL HIGH (ref 0–99)
Triglycerides: 79 mg/dL (ref 0–149)
VLDL Cholesterol Cal: 16 mg/dL (ref 5–40)

## 2015-08-23 LAB — COMPREHENSIVE METABOLIC PANEL
ALBUMIN: 4.2 g/dL (ref 3.5–5.5)
ALK PHOS: 100 IU/L (ref 39–117)
ALT: 12 IU/L (ref 0–32)
AST: 19 IU/L (ref 0–40)
Albumin/Globulin Ratio: 1.3 (ref 1.2–2.2)
BILIRUBIN TOTAL: 0.3 mg/dL (ref 0.0–1.2)
BUN / CREAT RATIO: 10 (ref 9–23)
BUN: 9 mg/dL (ref 6–20)
CHLORIDE: 101 mmol/L (ref 96–106)
CO2: 24 mmol/L (ref 18–29)
Calcium: 8.6 mg/dL — ABNORMAL LOW (ref 8.7–10.2)
Creatinine, Ser: 0.88 mg/dL (ref 0.57–1.00)
GFR calc Af Amer: 98 mL/min/{1.73_m2} (ref >59)
GFR calc non Af Amer: 85 mL/min/{1.73_m2} (ref >59)
GLOBULIN, TOTAL: 3.3 g/dL (ref 1.5–4.5)
GLUCOSE: 100 mg/dL — AB (ref 65–99)
POTASSIUM: 4.4 mmol/L (ref 3.5–5.2)
Sodium: 139 mmol/L (ref 134–144)
Total Protein: 7.5 g/dL (ref 6.0–8.5)

## 2015-08-23 LAB — CBC
HEMATOCRIT: 35.2 % (ref 34.0–46.6)
Hemoglobin: 10.7 g/dL — ABNORMAL LOW (ref 11.1–15.9)
MCH: 23 pg — AB (ref 26.6–33.0)
MCHC: 30.4 g/dL — ABNORMAL LOW (ref 31.5–35.7)
MCV: 76 fL — ABNORMAL LOW (ref 79–97)
Platelets: 379 10*3/uL (ref 150–379)
RBC: 4.66 x10E6/uL (ref 3.77–5.28)
RDW: 18.2 % — ABNORMAL HIGH (ref 12.3–15.4)
WBC: 6 10*3/uL (ref 3.4–10.8)

## 2015-08-23 LAB — HEMOGLOBIN A1C
ESTIMATED AVERAGE GLUCOSE: 120 mg/dL
HEMOGLOBIN A1C: 5.8 % — AB (ref 4.8–5.6)

## 2015-08-23 LAB — TSH: TSH: 1.64 u[IU]/mL (ref 0.450–4.500)

## 2015-08-23 LAB — VITAMIN D 25 HYDROXY (VIT D DEFICIENCY, FRACTURES): VIT D 25 HYDROXY: 5.1 ng/mL — AB (ref 30.0–100.0)

## 2015-08-23 LAB — CYTOLOGY - PAP

## 2015-08-24 ENCOUNTER — Telehealth: Payer: Self-pay | Admitting: Adult Health

## 2015-08-24 NOTE — Telephone Encounter (Signed)
Left message to call about labs 

## 2015-08-27 ENCOUNTER — Telehealth: Payer: Self-pay | Admitting: Adult Health

## 2015-08-27 DIAGNOSIS — E559 Vitamin D deficiency, unspecified: Secondary | ICD-10-CM | POA: Insufficient documentation

## 2015-08-27 MED ORDER — CHOLECALCIFEROL 125 MCG (5000 UT) PO CAPS: 5000.0000 [IU] | ORAL_CAPSULE | Freq: Every day | ORAL | Status: DC

## 2015-08-27 NOTE — Telephone Encounter (Signed)
Pt aware of labs and need to take MV with fe and vitamin D3 5000 IU daily and decrease carbs check labs in 6 months put in recall

## 2015-09-05 ENCOUNTER — Ambulatory Visit: Payer: Medicaid Other | Admitting: Obstetrics and Gynecology

## 2015-09-06 ENCOUNTER — Telehealth: Payer: Self-pay | Admitting: Obstetrics and Gynecology

## 2015-09-06 ENCOUNTER — Encounter: Payer: Self-pay | Admitting: Obstetrics and Gynecology

## 2015-09-06 ENCOUNTER — Ambulatory Visit (INDEPENDENT_AMBULATORY_CARE_PROVIDER_SITE_OTHER): Payer: Medicaid Other | Admitting: Obstetrics and Gynecology

## 2015-09-06 VITALS — BP 140/90 | HR 80 | Ht 67.0 in | Wt 209.5 lb

## 2015-09-06 DIAGNOSIS — A630 Anogenital (venereal) warts: Secondary | ICD-10-CM | POA: Diagnosis not present

## 2015-09-06 NOTE — Progress Notes (Signed)
Patient ID: Donna Moore Mahl, female   DOB: October 02, 1980, 35 y.o.   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 female who presents for minor skin surgery.  The patient understands all risks, benefits, indications, potential complications, and alternatives, and freely consents for the procedure.  The patient also understands the option of performing no surgery, the risk for scarring, and the technique of the procedure.  ANESTHESIA:  Local.  TECHNIQUE:  After informed consent was obtained, and after the skin was prepped and draped, 1% lidocaine without epinephrine for anesthetic was injected around and underneath the site. Of 5 skin tags of old condyloma on the left buttock, and for tags anal area on the right side of the anus   shave excision was performed.  Silver nitrate was applied and wound care instructions were provided.  Twanna tolerated the procedure well and without complications.  The patient will be alert for any signs of cutaneous infection and will follow up as instructed. She did notice some stinging sensation afterwards, will go home home and wash off the area and apply topical Neosporin

## 2015-09-06 NOTE — Telephone Encounter (Signed)
Pt called stating that she has been alternating between tylenol and ibuprofen 800 mg. Pt states that she is still in pain. Pt would like to know if Dr. Emelda FearFerguson could prescribed something else. Please contact pt

## 2015-09-07 NOTE — Telephone Encounter (Signed)
Pt states a cream was called into her pharmacy by Dr. Emelda FearFerguson yesterday started using today, not seeing much improvement and areas were skin tags were removed are still bleeding. Please advise.

## 2015-09-07 NOTE — Telephone Encounter (Signed)
Pt still slightly uncomfortable, had 5%lidocaine gel called in yesterday, pt couldn't get before pharmacy closed. Pt declines offer of recheck today. F/u prn

## 2016-05-13 ENCOUNTER — Ambulatory Visit (INDEPENDENT_AMBULATORY_CARE_PROVIDER_SITE_OTHER): Payer: Medicaid Other

## 2016-05-13 ENCOUNTER — Ambulatory Visit (INDEPENDENT_AMBULATORY_CARE_PROVIDER_SITE_OTHER): Payer: Medicaid Other | Admitting: Orthopedic Surgery

## 2016-05-13 ENCOUNTER — Encounter: Payer: Self-pay | Admitting: Orthopedic Surgery

## 2016-05-13 VITALS — BP 152/106 | HR 115 | Ht 67.5 in | Wt 249.0 lb

## 2016-05-13 DIAGNOSIS — M25561 Pain in right knee: Secondary | ICD-10-CM | POA: Diagnosis not present

## 2016-05-13 DIAGNOSIS — S83241A Other tear of medial meniscus, current injury, right knee, initial encounter: Secondary | ICD-10-CM

## 2016-05-13 MED ORDER — NAPROXEN 500 MG PO TABS: 500.0000 mg | ORAL_TABLET | Freq: Two times a day (BID) | ORAL | 0 refills | Status: DC

## 2016-05-13 NOTE — Progress Notes (Signed)
NEW PROBLEM  Chief complaint right knee pain  History this is a 36 rolled female who presents with right knee pain is primarily anterior knee pain. She was previously treated by Dr. CASE. Her symptoms and history are as follows  She injured the right knee in high school but never really got it checked out she noticed a prominence over the tibial tubercle she presents now with anterior knee pain, pain when climbing and descending the steps, peripatellar pain and pain at the patellar tendon insertion  Her previous treatment includes ibuprofen, Tylenol, tramadol and 2 cortisone injections and physical therapy at [facility] outpatient therapy center and she has not improved  She wanted a second opinion regarding further treatment  Review of Systems  Constitutional: Negative for chills, fever and weight loss.  Respiratory: Negative for shortness of breath.   Cardiovascular: Negative for chest pain.  Neurological: Negative for tingling.   Past Medical History:  Diagnosis Date  . Accessory skin tags 08/22/2015  . Benign essential hypertension with delivery 01/26/2013  . Constipation 01/28/2013  . Gestational diabetes   . Heart murmur   . Incisional infection 01/26/2013  . Menstrual cramps 08/22/2015  . Patient desires pregnancy 08/22/2015  . Pregnancy 06/21/2012  . Pregnancy induced hypertension     Past Surgical History:  Procedure Laterality Date  . CESAREAN SECTION N/A 01/19/2013   Procedure: CESAREAN SECTION;  Surgeon: Tilda Burrow, MD;  Location: [facility];  Service: Obstetrics;  Laterality: N/A;  . cyst drained     Emergency surg, and in hospital for 1 week.    Physical Exam  Constitutional: She is oriented to person, place, and time. She appears well-developed and well-nourished. No distress.  HENT:  Head: Normocephalic and atraumatic.  Right Ear: External ear normal.  Left Ear: External ear normal.  Eyes: Conjunctivae are normal. Right eye exhibits discharge. Left eye exhibits no  discharge.  Cardiovascular: Intact distal pulses.   Neurological: She is alert and oriented to person, place, and time. She displays normal reflexes. No sensory deficit. She exhibits normal muscle tone. Coordination normal.  Skin: Skin is warm. Capillary refill takes less than 2 seconds. No rash noted. She is not diaphoretic. No erythema. No pallor.  Psychiatric: She has a normal mood and affect. Her behavior is normal. Judgment and thought content normal.   Patient has bilateral hyperextension of both knees chronic  She has no tenderness or crepitance in the left patella. She has full range of motion of the knee. Cruciate ligaments are intact. Collateral ligaments are stable. No joint effusion is seen. Quadriceps muscle tone is normal  Right knee exam The medial facet of the patella is tender she has increased excursion laterally. Cruciate ligaments are intact. She has medial and lateral joint line tenderness. Tenderness in the quadriceps tendon insertion and patellar tendon insertion. She has a prominent tibial tubercle most likely from Osgood-Schlatter's disease + McMurray's sign. No joint effusion.   Plain films are normal, there is fragmentation of the tibial tubercle and superior and inferior osteophytes are noted at the patella on the lateral x-ray  Encounter Diagnoses  Name Primary?  . Right knee pain, unspecified chronicity Yes  . Acute medial meniscus tear of right knee, initial encounter   . Patellar pain, right     The patient has had complete workup including x-rays and she has had adequate rehabilitation and has not improved. At this time it is prudent to do an MRI of the knee to rule out any intra-articular  pathology and then we can proceed with treatment options for patellofemoral arthritis.  Meds ordered this encounter  Medications  . naproxen (NAPROSYN) 500 MG tablet    Sig: Take 1 tablet (500 mg total) by mouth 2 (two) times daily with a meal.    Dispense:  60  tablet    Refill:  0     BP (!) 152/106   Pulse (!) 115   Ht 5' 7.5" (1.715 m)   Wt 249 lb (112.9 kg)   BMI 38.42 kg/m

## 2016-05-20 ENCOUNTER — Ambulatory Visit (HOSPITAL_COMMUNITY)
Admission: RE | Admit: 2016-05-20 | Discharge: 2016-05-20 | Disposition: A | Discharge status: Home / Self Care | Payer: Medicaid Other | Source: Ambulatory Visit | Attending: Orthopedic Surgery | Admitting: Orthopedic Surgery

## 2016-05-20 DIAGNOSIS — M2241 Chondromalacia patellae, right knee: Secondary | ICD-10-CM | POA: Insufficient documentation

## 2016-05-20 DIAGNOSIS — X58XXXA Exposure to other specified factors, initial encounter: Secondary | ICD-10-CM | POA: Insufficient documentation

## 2016-05-20 DIAGNOSIS — S83421A Sprain of lateral collateral ligament of right knee, initial encounter: Secondary | ICD-10-CM | POA: Diagnosis present

## 2016-05-20 DIAGNOSIS — S83241A Other tear of medial meniscus, current injury, right knee, initial encounter: Secondary | ICD-10-CM

## 2016-05-30 ENCOUNTER — Ambulatory Visit: Payer: Medicaid Other | Admitting: Orthopedic Surgery

## 2016-06-06 ENCOUNTER — Ambulatory Visit (INDEPENDENT_AMBULATORY_CARE_PROVIDER_SITE_OTHER): Payer: Medicaid Other | Admitting: Orthopedic Surgery

## 2016-06-06 ENCOUNTER — Encounter: Payer: Self-pay | Admitting: Orthopedic Surgery

## 2016-06-06 DIAGNOSIS — M25561 Pain in right knee: Secondary | ICD-10-CM

## 2016-06-06 DIAGNOSIS — M2241 Chondromalacia patellae, right knee: Secondary | ICD-10-CM

## 2016-06-06 NOTE — Addendum Note (Signed)
Addended by: Adella Hare B on: 06/06/2016 12:08 PM   Modules accepted: Orders, SmartSet

## 2016-06-06 NOTE — Progress Notes (Signed)
Patient ID: Donna Moore, female   DOB: 1980/04/13, 36 y.o.   MRN: [id number]  MRI FOLLOW UP  Chief Complaint  Patient presents with  . Follow-up    mri review right knee    HPI Donna Moore is a 36 y.o. female.    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    Physical Exam  Please see my last note  Data  Independent image interpretation the MRI showed  No meniscal tear is no ligament tears irregular cartilage in the retropatellar region   The report was read as follows     The plan is to    IMPRESSION: 1. 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 There is also mild inflammation in the lateral aspect of Hoffa's fat. 2. TT-TG distance is 10 mm. 3. Changes of remote Osgood-Schlatter disease. 4. No meniscal tears and intact ligamentous structures.     Electronically Signed   By: Rudie Meyer M.D.   On: 05/20/2016 16:08  Encounter Diagnoses  Name Primary?  . Patellar pain, right Yes  . Chondromalacia of right patella       We will now proceed with arthroscopic surgery and chondroplasty of the right patella  Fuller Canada, MD 06/06/2016 10:26 AM

## 2016-06-06 NOTE — Patient Instructions (Signed)
You have decided to proceed with operative arthroscopy of the knee. You have decided not to continue with nonoperative measures such as but not limited to oral medication, weight loss, activity modification, physical therapy, bracing, or injection.  We will perform operative arthroscopy of the knee. Some of the risks associated with arthroscopic surgery of the knee include but are not limited to Bleeding Infection Swelling Stiffness Blood clot Pain  If you're not comfortable with these risks and would like to continue with nonoperative treatment please let Dr. Zenora Karpel know prior to your surgery. 

## 2016-06-09 NOTE — Patient Instructions (Signed)
Donna Moore  06/09/2016     @   Your procedure is scheduled on  06/18/2016   Report to [facility] at  910  A.M.  Call this number if you have problems the morning of surgery:  [phone]   Remember:  Do not eat food or drink liquids after midnight.  Take these medicines the morning of surgery with A SIP OF WATER  Benazepril.   Do not wear jewelry, make-up or nail polish.  Do not wear lotions, powders, or perfumes, or deoderant.  Do not shave 48 hours prior to surgery.  Men may shave face and neck.  Do not bring valuables to the hospital.  [facility] is not responsible for any belongings or valuables.  Contacts, dentures or bridgework may not be worn into surgery.  Leave your suitcase in the car.  After surgery it may be brought to your room.  For patients admitted to the hospital, discharge time will be determined by your treatment team.  Patients discharged the day of surgery will not be allowed to drive home.   Name and phone number of your driver:   family Special instructions:  None  Please read over the following fact sheets that you were given. Anesthesia Post-op Instructions and Care and Recovery After Surgery       Lateral Patellar Compression Syndrome Lateral patellar compression syndrome is a knee condition that causes pain in the front of the knee. The condition happens when the kneecap (patella) slides too far to the side. What are the causes? This condition is caused by too much contact between the patella and the thigh bone (femur). It can happen when a band of tissue in your knee is too tight and pulls your patella to the side. This band of tissue is called a lateral retinaculum. What increases the risk? The following factors make you more likely to develop this condition:  Running and jumping on hard surfaces.  Using improper sports equipment or technique.  Increasing the number or length of your  workouts.  Having a leg that is not completely straight at the hip or knee.  Having weak hip and thigh muscles. What are the signs or symptoms? The main symptom of this condition is pain in the front of the knee, especially when you bend or straighten your knee. The pain often gets worse when you squat, sit, and climb stairs. Over time, the pain may be consistent. Other symptoms of this condition include:  Knee swelling.  Pain when moving or pressing on the patella.  Locking, catching, or clicking of the knee.  Difficulty straightening the knee fully. How is this diagnosed? This condition may be diagnosed based on:  Your symptoms.  Your medical history.  A physical exam.  Imaging tests, such as:  X-rays.  MRI.  A CT scan. During your exam, your health care provider may ask you to squat and bend your knee. He or she may also lift the outside of your patella to see if your pain goes away. How is this treated? This condition may be treated by:  Resting your knee and avoiding activities that cause pain.  Taping your patella.  Taking an NSAID to reduce pain and swelling.  Using a shoe insert to improve leg alignment.  Using an elastic sleeve or wrap to support your knee.  Doing exercises to strengthen your thigh muscles (physical therapy). If your condition does not improve with these  treatments, you may need surgery to release your lateral retinaculum. After surgery, you may need to wear a knee brace and use crutches to protect your knee and have physical therapy. Follow these instructions at home: If You Have a Sleeve or Wrap:   Wear it as told by your health care provider. Remove it only as told by your health care provider.  Loosen it if your toes tingle, become numb, or turn cold and blue.  Do not let it get wet if it is not waterproof.  Keep it clean. Managing pain, stiffness, and swelling   If directed, apply ice to your knee.  Put ice in a plastic  bag.  Place a towel between your skin and the bag.  Leave the ice on for 20 minutes, 2-3 times a day.  Move your toes often to avoid stiffness and to lessen swelling.  Raise (elevate) your above the level of your heart while you are sitting or lying down. Activity   Return to your normal activities as told by your health care provider. Ask your health care provider what activities are safe for you.  Do exercises as told by your health care provider. General instructions   If you have a shoe insert, use it as told by your health care provider.  Do not use any tobacco products, such as cigarettes, chewing tobacco, and e-cigarettes. Tobacco can delay healing. If you need help quitting, ask your health care provider.  Take over-the-counter and prescription medicines only as told by your health care provider.  Keep all follow-up visits as told by your health care provider. This is important. How is this prevented?  Warm up and stretch before being active.  Cool down and stretch after being active.  Give your body time to rest between periods of activity.  Make sure to use equipment that fits you.  Be safe and responsible while being active to avoid falls.  Maintain physical fitness, including:  Strength.  Flexibility. Contact a health care provider if:  Your symptoms do not get better.  Your symptoms get worse. This information is not intended to replace advice given to you by your health care provider. Make sure you discuss any questions you have with your health care provider. Document Released: 02/03/2005 Document Revised: 10/09/2015 Document Reviewed: 01/12/2015 Elsevier Interactive Patient Education  2017 [employer] Knee Arthroscopy Knee arthroscopy is a surgical procedure that is used to examine the inside of your knee joint and repair any damage. The surgeon puts a small, lighted instrument with a camera on the tip (arthroscope) through a small incision in your  knee. The camera sends pictures to a monitor in the operating room. Your surgeon uses those pictures to guide the surgical instruments through other incisions to the area of damage. Knee arthroscopy can be used to treat many types of knee problems. It may be used:  To repair a torn ligament.  To repair or remove damaged tissue.  To remove a fluid-filled sac (cyst) from your knee. Tell a health care provider about:  Any allergies you have.  All medicines you are taking, including vitamins, herbs, eye drops, creams, and over-the-counter medicines.  Any problems you or family members have had with anesthetic medicines.  Any blood disorders you have.  Any surgeries you have had.  Any medical conditions you have. What are the risks? Generally, this is a safe procedure. However, problems may occur, including:  Infection.  Bleeding.  Damage to blood vessels, nerves, or structures of  your knee.  A blood clot that forms in your leg and travels to your lung.  Failure to relieve symptoms. What happens before the procedure?  Ask your health care provider about:  Changing or stopping your regular medicines. This is especially important if you are taking diabetes medicines or blood thinners.  Taking medicines such as aspirin and ibuprofen. These medicines can thin your blood. Do not take these medicines before your procedure if your health care provider instructs you not to.  Follow your health care provider's instructions about eating or drinking restrictions.  Plan to have someone take you home after the procedure.  If you go home right after the procedure, plan to have someone with you for 24 hours.  Do not drink alcohol unless your health care provider says that you can.  Do not use any tobacco products, including cigarettes, chewing tobacco, or electronic cigarettes unless your health care provider says that you can. If you need help quitting, ask your health care  provider.  You may have a physical exam. What happens during the procedure?  An IV tube will be inserted into one of your veins.  You will be given one or more of the following:  A medicine that helps you relax (sedative).  A medicine that numbs the area (local anesthetic).  A medicine that makes you fall asleep (general anesthetic).  A medicine that is injected into your spine that numbs the area below and slightly above the injection site (spinal anesthetic).  A medicine that is injected into an area of your body that numbs everything below the injection site (regional anesthetic).  A cuff may be placed around your upper leg to slow bleeding during the procedure.  The surgeon will make a small number of incisions around your knee.  Your knee joint will be flushed and filled with a germ-free (sterile) solution.  The arthroscope will be passed through an incision into your knee joint.  More instruments will be passed through other incisions to repair your knee as needed.  The fluid will be removed from your knee.  The incisions will be closed with adhesive strips or stitches (sutures).  A bandage (dressing) will be placed over your knee. The procedure may vary among health care providers and hospitals. What happens after the procedure?  Your blood pressure, heart rate, breathing rate and blood oxygen level will be monitored often until the medicines you were given have worn off.  You may be given medicine for pain.  You may get crutches to help you walk without using your knee to support your body weight.  You may have to wear compression stockings. These stocking help to prevent blood clots and reduce swelling in your legs. This information is not intended to replace advice given to you by your health care provider. Make sure you discuss any questions you have with your health care provider. Document Released: 02/01/2000 Document Revised: 07/12/2015 Document Reviewed:  01/30/2014 Elsevier Interactive Patient Education  2017 [employer] Knee Arthroscopy, Care After Refer to this sheet in the next few weeks. These instructions provide you with information about caring for yourself after your procedure. Your health care provider may also give you more specific instructions. Your treatment has been planned according to current medical practices, but problems sometimes occur. Call your health care provider if you have any problems or questions after your procedure. What can I expect after the procedure? After the procedure, it is common to have:  Soreness.  Pain. Follow  these instructions at home: Bathing   Do not take baths, swim, or use a hot tub until your health care provider approves. Incision care   There are many different ways to close and cover an incision, including stitches, skin glue, and adhesive strips. Follow your health care provider's instructions about:  Incision care.  Bandage (dressing) changes and removal.  Incision closure removal.  Check your incision area every day for signs of infection. Watch for:  Redness, swelling, or pain.  Fluid, blood, or pus. Activity   Avoid strenuous activities for as long as directed by your health care provider.  Return to your normal activities as directed by your health care provider. Ask your health care provider what activities are safe for you.  Perform range-of-motion exercises only as directed by your health care provider.  Do not lift anything that is heavier than 10 lb (4.5 kg).  Do not drive or operate heavy machinery while taking pain medicine.  If you were given crutches, use them as directed by your health care provider. Managing pain, stiffness, and swelling   If directed, apply ice to the injured area:  Put ice in a plastic bag.  Place a towel between your skin and the bag.  Leave the ice on for 20 minutes, 2-3 times per day.  Raise the injured area above the level  of your heart while you are sitting or lying down as directed by your health care provider. General instructions   Keep all follow-up visits as directed by your health care provider. This is important.  Take medicines only as directed by your health care provider.  Do not use any tobacco products, including cigarettes, chewing tobacco, or electronic cigarettes. If you need help quitting, ask your health care provider.  If you were given compression stockings, wear them as directed by your health care provider. These stockings help prevent blood clots and reduce swelling in your legs. Contact a health care provider if:  You have severe pain with any movement of your knee.  You notice a bad smell coming from the incision or dressing.  You have redness, swelling, or pain at the site of your incision.  You have fluid, blood, or pus coming from your incision. Get help right away if:  You develop a rash.  You have a fever.  You have difficulty breathing or have shortness of breath.  You develop pain in your calves or in the back of your knee.  You develop chest pain.  You develop numbness or tingling in your leg or foot. This information is not intended to replace advice given to you by your health care provider. Make sure you discuss any questions you have with your health care provider. Document Released: 08/23/2004 Document Revised: 07/06/2015 Document Reviewed: 01/30/2014 Elsevier Interactive Patient Education  2017 [employer] PATIENT INSTRUCTIONS POST-ANESTHESIA  IMMEDIATELY FOLLOWING SURGERY:  Do not drive or operate machinery for the first twenty four hours after surgery.  Do not make any important decisions for twenty four hours after surgery or while taking narcotic pain medications or sedatives.  If you develop intractable nausea and vomiting or a severe headache please notify your doctor immediately.  FOLLOW-UP:  Please make an appointment with your surgeon as  instructed. You do not need to follow up with anesthesia unless specifically instructed to do so.  WOUND CARE INSTRUCTIONS (if applicable):  Keep a dry clean dressing on the anesthesia/puncture wound site if there is drainage.  Once the wound has quit  draining you may leave it open to air.  Generally you should leave the bandage intact for twenty four hours unless there is drainage.  If the epidural site drains for more than 36-48 hours please call the anesthesia department.  QUESTIONS?:  Please feel free to call your physician or the hospital operator if you have any questions, and they will be happy to assist you.

## 2016-06-12 ENCOUNTER — Encounter (HOSPITAL_COMMUNITY): Payer: Self-pay

## 2016-06-12 ENCOUNTER — Encounter (HOSPITAL_COMMUNITY)
Admission: RE | Admit: 2016-06-12 | Discharge: 2016-06-12 | Disposition: A | Discharge status: Home / Self Care | Payer: Medicaid Other | Source: Ambulatory Visit | Attending: Orthopedic Surgery | Admitting: Orthopedic Surgery

## 2016-06-12 DIAGNOSIS — M2241 Chondromalacia patellae, right knee: Secondary | ICD-10-CM | POA: Insufficient documentation

## 2016-06-12 DIAGNOSIS — I4510 Unspecified right bundle-branch block: Secondary | ICD-10-CM | POA: Insufficient documentation

## 2016-06-12 DIAGNOSIS — Z01818 Encounter for other preprocedural examination: Secondary | ICD-10-CM | POA: Insufficient documentation

## 2016-06-12 DIAGNOSIS — Z01812 Encounter for preprocedural laboratory examination: Secondary | ICD-10-CM | POA: Insufficient documentation

## 2016-06-12 LAB — HCG, SERUM, QUALITATIVE: PREG SERUM: NEGATIVE

## 2016-06-12 LAB — BASIC METABOLIC PANEL
Anion gap: 8 (ref 5–15)
BUN: 7 mg/dL (ref 6–20)
CALCIUM: 9 mg/dL (ref 8.9–10.3)
CO2: 25 mmol/L (ref 22–32)
CREATININE: 0.99 mg/dL (ref 0.44–1.00)
Chloride: 101 mmol/L (ref 101–111)
GFR calc Af Amer: >60 mL/min (ref >60)
Glucose, Bld: 102 mg/dL — ABNORMAL HIGH (ref 65–99)
POTASSIUM: 3.9 mmol/L (ref 3.5–5.1)
SODIUM: 134 mmol/L — AB (ref 135–145)

## 2016-06-12 LAB — CBC WITH DIFFERENTIAL/PLATELET
BASOS ABS: 0 10*3/uL (ref 0.0–0.1)
Basophils Relative: 1 %
Eosinophils Absolute: 0.1 10*3/uL (ref 0.0–0.7)
Eosinophils Relative: 2 %
HEMATOCRIT: 33.6 % — AB (ref 36.0–46.0)
HEMOGLOBIN: 10.3 g/dL — AB (ref 12.0–15.0)
LYMPHS PCT: 34 %
Lymphs Abs: 2.2 10*3/uL (ref 0.7–4.0)
MCH: 21.1 pg — AB (ref 26.0–34.0)
MCHC: 30.7 g/dL (ref 30.0–36.0)
MCV: 69 fL — ABNORMAL LOW (ref 78.0–100.0)
MONO ABS: 0.7 10*3/uL (ref 0.1–1.0)
Monocytes Relative: 10 %
NEUTROS ABS: 3.4 10*3/uL (ref 1.7–7.7)
Neutrophils Relative %: 53 %
PLATELETS: 335 10*3/uL (ref 150–400)
RBC: 4.87 MIL/uL (ref 3.87–5.11)
RDW: 19.5 % — ABNORMAL HIGH (ref 11.5–15.5)
WBC: 6.4 10*3/uL (ref 4.0–10.5)

## 2016-06-12 LAB — SURGICAL PCR SCREEN
MRSA, PCR: NEGATIVE
Staphylococcus aureus: POSITIVE — AB

## 2016-06-12 MED ORDER — MUPIROCIN 2 % EX OINT
TOPICAL_OINTMENT | CUTANEOUS | Status: AC
  Filled 2016-06-12: qty 22

## 2016-06-17 NOTE — H&P (Signed)
ADMIT TO OUTPATIENT SURGERY    Chief complaint right knee pain   History this is a 36 rolled female who presents with right knee pain is primarily anterior knee pain. She was previously treated by Dr. CASE. Her symptoms and history are as follows   She injured the right knee in high school but never really got it checked out she noticed a prominence over the tibial tubercle she presents now with anterior knee pain, pain when climbing and descending the steps, peripatellar pain and pain at the patellar tendon insertion   Her previous treatment includes ibuprofen, Tylenol, tramadol and 2 cortisone injections and physical therapy at [facility] outpatient therapy center and she has not improved   She wanted a second opinion regarding further treatment   Review of Systems  Constitutional: Negative for chills, fever and weight loss.  Respiratory: Negative for shortness of breath.   Cardiovascular: Negative for chest pain.  Neurological: Negative for tingling.        Past Medical History:  Diagnosis Date  . Accessory skin tags 08/22/2015  . Benign essential hypertension with delivery 01/26/2013  . Constipation 01/28/2013  . Gestational diabetes    . Heart murmur    . Incisional infection 01/26/2013  . Menstrual cramps 08/22/2015  . Patient desires pregnancy 08/22/2015  . Pregnancy 06/21/2012  . Pregnancy induced hypertension             Past Surgical History:  Procedure Laterality Date  . CESAREAN SECTION N/A 01/19/2013    Procedure: CESAREAN SECTION;  Surgeon: Tilda Burrow, MD;  Location: [facility];  Service: Obstetrics;  Laterality: N/A;  . cyst drained        Emergency surg, and in hospital for 1 week.    Family History  Problem Relation Age of Onset  . Hypertension Mother   . Diabetes Mother   . Hypertension Father   . Diabetes Brother    Social History  Substance Use Topics  . Smoking status: Former Smoker    Years: 1.00    Types: Cigars    Quit date: 01/11/2012  . Smokeless  tobacco: Never Used     Comment: 2-3 times week  . Alcohol use No    Physical Exam  Constitutional: She is oriented to person, place, and time. She appears well-developed and well-nourished. No distress.  HENT:  Head: Normocephalic and atraumatic.  Right Ear: External ear normal.  Left Ear: External ear normal.  Eyes: Conjunctivae are normal. Right eye exhibits discharge. Left eye exhibits no discharge.  Cardiovascular: Intact distal pulses.   Neurological: She is alert and oriented to person, place, and time. She displays normal reflexes. No sensory deficit. She exhibits normal muscle tone. Coordination normal.  Skin: Skin is warm. Capillary refill takes less than 2 seconds. No rash noted. She is not diaphoretic. No erythema. No pallor.  Psychiatric: She has a normal mood and affect. Her behavior is normal. Judgment and thought content normal.    Patient has bilateral hyperextension of both knees chronic   She has no tenderness or crepitance in the left patella. She has full range of motion of the knee. Cruciate ligaments are intact. Collateral ligaments are stable. No joint effusion is seen. Quadriceps muscle tone is normal   Right knee exam The medial facet of the patella is tender she has increased excursion laterally. Cruciate ligaments are intact. She has medial and lateral joint line tenderness. Tenderness in the quadriceps tendon insertion and patellar tendon insertion. She has a prominent  tibial tubercle most likely from Osgood-Schlatter's disease + McMurray's sign. No joint effusion.     Plain films are normal, there is fragmentation of the tibial tubercle and superior and inferior osteophytes are noted at the patella on the lateral x-ray     MRI:   IMPRESSION: 1. 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 There is also mild inflammation in the lateral aspect of Hoffa's fat. 2. TT-TG  distance is 10 mm. 3. Changes of remote Osgood-Schlatter disease. 4. No meniscal tears and intact ligamentous structures.     Electronically Signed   By: Rudie Meyer M.D.   On: 05/20/2016 16:08   Ms. Deahl has agreed to undergo arthroscopic surgery understanding that she has a 50% chance of improving depending on what we find what can be done about the chondrosis/chondromalacia of her patella   DX: chondromalacia patella Plan is for arthroscopy right knee with chondroplasty

## 2016-06-18 ENCOUNTER — Ambulatory Visit (HOSPITAL_COMMUNITY)
Admission: RE | Admit: 2016-06-18 | Discharge: 2016-06-18 | Disposition: A | Discharge status: Home / Self Care | Payer: Medicaid Other | Source: Ambulatory Visit | Attending: Orthopedic Surgery | Admitting: Orthopedic Surgery

## 2016-06-18 ENCOUNTER — Ambulatory Visit (HOSPITAL_COMMUNITY): Payer: Medicaid Other | Admitting: Anesthesiology

## 2016-06-18 ENCOUNTER — Encounter (HOSPITAL_COMMUNITY): Payer: Self-pay | Admitting: *Deleted

## 2016-06-18 ENCOUNTER — Encounter (HOSPITAL_COMMUNITY)
Admission: RE | Disposition: A | Discharge status: Home / Self Care | Payer: Self-pay | Source: Ambulatory Visit | Attending: Orthopedic Surgery

## 2016-06-18 DIAGNOSIS — Z6838 Body mass index (BMI) 38.0-38.9, adult: Secondary | ICD-10-CM | POA: Insufficient documentation

## 2016-06-18 DIAGNOSIS — M2241 Chondromalacia patellae, right knee: Secondary | ICD-10-CM | POA: Diagnosis not present

## 2016-06-18 DIAGNOSIS — Z79899 Other long term (current) drug therapy: Secondary | ICD-10-CM | POA: Insufficient documentation

## 2016-06-18 DIAGNOSIS — I10 Essential (primary) hypertension: Secondary | ICD-10-CM | POA: Insufficient documentation

## 2016-06-18 DIAGNOSIS — Z87891 Personal history of nicotine dependence: Secondary | ICD-10-CM | POA: Insufficient documentation

## 2016-06-18 DIAGNOSIS — M94261 Chondromalacia, right knee: Secondary | ICD-10-CM

## 2016-06-18 HISTORY — PX: KNEE ARTHROSCOPY: SHX127

## 2016-06-18 LAB — GLUCOSE, CAPILLARY: Glucose-Capillary: 112 mg/dL — ABNORMAL HIGH (ref 65–99)

## 2016-06-18 SURGERY — ARTHROSCOPY, KNEE
Anesthesia: General | Site: Knee | Laterality: Right

## 2016-06-18 MED ORDER — MIDAZOLAM HCL 2 MG/2ML IJ SOLN
INTRAMUSCULAR | Status: AC
  Filled 2016-06-18: qty 2

## 2016-06-18 MED ORDER — MIDAZOLAM HCL 2 MG/2ML IJ SOLN
1.0000 mg | INTRAMUSCULAR | Status: AC
  Administered 2016-06-18: 2 mg via INTRAVENOUS

## 2016-06-18 MED ORDER — SODIUM CHLORIDE 0.9 % IR SOLN
Status: DC | PRN
  Administered 2016-06-18: 1000 mL

## 2016-06-18 MED ORDER — LIDOCAINE HCL (PF) 1 % IJ SOLN
INTRAMUSCULAR | Status: AC
  Filled 2016-06-18: qty 5

## 2016-06-18 MED ORDER — IBUPROFEN 800 MG PO TABS: 800.0000 mg | ORAL_TABLET | Freq: Three times a day (TID) | ORAL | 1 refills | Status: DC | PRN

## 2016-06-18 MED ORDER — GLYCOPYRROLATE 0.2 MG/ML IJ SOLN
INTRAMUSCULAR | Status: AC
  Filled 2016-06-18: qty 1

## 2016-06-18 MED ORDER — ARTIFICIAL TEARS OPHTHALMIC OINT
TOPICAL_OINTMENT | OPHTHALMIC | Status: AC
  Filled 2016-06-18: qty 3.5

## 2016-06-18 MED ORDER — BUPIVACAINE-EPINEPHRINE (PF) 0.5% -1:200000 IJ SOLN
INTRAMUSCULAR | Status: AC
  Filled 2016-06-18: qty 30

## 2016-06-18 MED ORDER — PROPOFOL 10 MG/ML IV BOLUS
INTRAVENOUS | Status: AC
  Filled 2016-06-18: qty 40

## 2016-06-18 MED ORDER — PHENYLEPHRINE HCL 10 MG/ML IJ SOLN
INTRAMUSCULAR | Status: DC | PRN
  Administered 2016-06-18 (×2): 80 ug via INTRAVENOUS

## 2016-06-18 MED ORDER — SUCCINYLCHOLINE CHLORIDE 20 MG/ML IJ SOLN
INTRAMUSCULAR | Status: AC
  Filled 2016-06-18: qty 1

## 2016-06-18 MED ORDER — HYDROCODONE-ACETAMINOPHEN 5-325 MG PO TABS: 1.0000 | ORAL_TABLET | ORAL | 0 refills | Status: DC | PRN

## 2016-06-18 MED ORDER — EPHEDRINE SULFATE 50 MG/ML IJ SOLN
INTRAMUSCULAR | Status: AC
  Filled 2016-06-18: qty 1

## 2016-06-18 MED ORDER — CHLORHEXIDINE GLUCONATE 4 % EX LIQD: 60.0000 mL | Freq: Once | CUTANEOUS | Status: DC

## 2016-06-18 MED ORDER — ONDANSETRON HCL 4 MG/2ML IJ SOLN
4.0000 mg | Freq: Once | INTRAMUSCULAR | Status: AC
  Administered 2016-06-18: 4 mg via INTRAVENOUS

## 2016-06-18 MED ORDER — FENTANYL CITRATE (PF) 100 MCG/2ML IJ SOLN
INTRAMUSCULAR | Status: AC
  Filled 2016-06-18: qty 2

## 2016-06-18 MED ORDER — PROPOFOL 10 MG/ML IV BOLUS
INTRAVENOUS | Status: DC | PRN
  Administered 2016-06-18: 170 mg via INTRAVENOUS
  Administered 2016-06-18: 30 mg via INTRAVENOUS

## 2016-06-18 MED ORDER — SODIUM CHLORIDE 0.9 % IR SOLN
Status: DC | PRN
  Administered 2016-06-18: 3000 mL

## 2016-06-18 MED ORDER — SODIUM CHLORIDE 0.9 % IJ SOLN
INTRAMUSCULAR | Status: AC
  Filled 2016-06-18: qty 10

## 2016-06-18 MED ORDER — HYDROMORPHONE HCL 1 MG/ML IJ SOLN
0.2500 mg | INTRAMUSCULAR | Status: DC | PRN
  Administered 2016-06-18 (×4): 0.5 mg via INTRAVENOUS
  Filled 2016-06-18 (×2): qty 1

## 2016-06-18 MED ORDER — ONDANSETRON HCL 4 MG/2ML IJ SOLN
INTRAMUSCULAR | Status: AC
  Filled 2016-06-18: qty 2

## 2016-06-18 MED ORDER — CEFAZOLIN SODIUM-DEXTROSE 2-4 GM/100ML-% IV SOLN
2.0000 g | INTRAVENOUS | Status: AC
  Administered 2016-06-18: 2 g via INTRAVENOUS
  Filled 2016-06-18: qty 100

## 2016-06-18 MED ORDER — FENTANYL CITRATE (PF) 100 MCG/2ML IJ SOLN
INTRAMUSCULAR | Status: DC | PRN
  Administered 2016-06-18: 25 ug via INTRAVENOUS
  Administered 2016-06-18: 50 ug via INTRAVENOUS
  Administered 2016-06-18 (×2): 25 ug via INTRAVENOUS

## 2016-06-18 MED ORDER — LACTATED RINGERS IV SOLN
INTRAVENOUS | Status: DC
  Administered 2016-06-18: 1000 mL via INTRAVENOUS
  Administered 2016-06-18: 07:00:00 via INTRAVENOUS

## 2016-06-18 MED ORDER — SODIUM CHLORIDE 0.9 % IR SOLN
Status: DC | PRN
  Administered 2016-06-18: 6000 mL

## 2016-06-18 MED ORDER — BUPIVACAINE-EPINEPHRINE (PF) 0.5% -1:200000 IJ SOLN
INTRAMUSCULAR | Status: DC | PRN
  Administered 2016-06-18: 60 mL via PERINEURAL

## 2016-06-18 MED ORDER — EPINEPHRINE PF 1 MG/ML IJ SOLN
INTRAMUSCULAR | Status: AC
  Filled 2016-06-18: qty 5

## 2016-06-18 MED ORDER — FENTANYL CITRATE (PF) 100 MCG/2ML IJ SOLN
INTRAMUSCULAR | Status: AC
Start: 2016-06-18 — End: ?
  Filled 2016-06-18: qty 2

## 2016-06-18 SURGICAL SUPPLY — 53 items
BAG HAMPER (MISCELLANEOUS) ×3 IMPLANT
BANDAGE ELASTIC 6 VELCRO NS (GAUZE/BANDAGES/DRESSINGS) ×3 IMPLANT
BLADE AGGRESSIVE PLUS 4.0 (BLADE) ×3 IMPLANT
BLADE SURG SZ11 CARB STEEL (BLADE) ×3 IMPLANT
BUR ROUND 5.0 (BURR) ×2 IMPLANT
CHLORAPREP W/TINT 26ML (MISCELLANEOUS) ×3 IMPLANT
CLOTH BEACON ORANGE TIMEOUT ST (SAFETY) ×3 IMPLANT
COOLER CRYO IC GRAV AND TUBE (ORTHOPEDIC SUPPLIES) ×3 IMPLANT
COVER PROBE W GEL 5X96 (DRAPES) ×3 IMPLANT
CUFF CRYO KNEE LG 20X31 COOLER (ORTHOPEDIC SUPPLIES) ×1 IMPLANT
CUFF CRYO KNEE18X23 MED (MISCELLANEOUS) ×2 IMPLANT
CUFF TOURNIQUET SINGLE 34IN LL (TOURNIQUET CUFF) ×2 IMPLANT
CUTTER ANGLED DBL BITE 4.5 (BURR) IMPLANT
DECANTER SPIKE VIAL GLASS SM (MISCELLANEOUS) ×6 IMPLANT
GAUZE SPONGE 4X4 12PLY STRL (GAUZE/BANDAGES/DRESSINGS) ×3 IMPLANT
GAUZE SPONGE 4X4 16PLY XRAY LF (GAUZE/BANDAGES/DRESSINGS) ×3 IMPLANT
GAUZE XEROFORM 5X9 LF (GAUZE/BANDAGES/DRESSINGS) ×3 IMPLANT
GLOVE BIOGEL PI IND STRL 6.5 (GLOVE) IMPLANT
GLOVE BIOGEL PI IND STRL 7.0 (GLOVE) ×1 IMPLANT
GLOVE BIOGEL PI INDICATOR 6.5 (GLOVE) ×2
GLOVE BIOGEL PI INDICATOR 7.0 (GLOVE) ×2
GLOVE SKINSENSE NS SZ8.0 LF (GLOVE) ×2
GLOVE SKINSENSE STRL SZ8.0 LF (GLOVE) ×1 IMPLANT
GLOVE SS N UNI LF 8.5 STRL (GLOVE) ×3 IMPLANT
GOWN STRL REUS W/TWL LRG LVL3 (GOWN DISPOSABLE) ×3 IMPLANT
GOWN STRL REUS W/TWL XL LVL3 (GOWN DISPOSABLE) ×3 IMPLANT
HLDR LEG FOAM (MISCELLANEOUS) ×1 IMPLANT
IV NS IRRIG 3000ML ARTHROMATIC (IV SOLUTION) ×10 IMPLANT
KIT BLADEGUARD II DBL (SET/KITS/TRAYS/PACK) ×3 IMPLANT
KIT ROOM TURNOVER AP CYSTO (KITS) ×3 IMPLANT
LEG HOLDER FOAM (MISCELLANEOUS) ×2
MANIFOLD NEPTUNE II (INSTRUMENTS) ×3 IMPLANT
MARKER SKIN DUAL TIP RULER LAB (MISCELLANEOUS) ×3 IMPLANT
NDL HYPO 18GX1.5 BLUNT FILL (NEEDLE) ×1 IMPLANT
NDL HYPO 21X1.5 SAFETY (NEEDLE) ×1 IMPLANT
NDL SPNL 18GX3.5 QUINCKE PK (NEEDLE) ×1 IMPLANT
NEEDLE HYPO 18GX1.5 BLUNT FILL (NEEDLE) ×3 IMPLANT
NEEDLE HYPO 21X1.5 SAFETY (NEEDLE) ×3 IMPLANT
NEEDLE SPNL 18GX3.5 QUINCKE PK (NEEDLE) ×3 IMPLANT
NS IRRIG 1000ML POUR BTL (IV SOLUTION) ×3 IMPLANT
PACK ARTHRO LIMB DRAPE STRL (MISCELLANEOUS) ×3 IMPLANT
PAD ABD 5X9 TENDERSORB (GAUZE/BANDAGES/DRESSINGS) ×3 IMPLANT
PAD ARMBOARD 7.5X6 YLW CONV (MISCELLANEOUS) ×3 IMPLANT
PADDING CAST COTTON 6X4 STRL (CAST SUPPLIES) ×3 IMPLANT
PADDING WEBRIL 6 STERILE (GAUZE/BANDAGES/DRESSINGS) ×2 IMPLANT
PROBE BIPOLAR 50 DEGREE SUCT (MISCELLANEOUS) ×3 IMPLANT
PROBE BIPOLAR ATHRO 135MM 90D (MISCELLANEOUS) ×2 IMPLANT
SET ARTHROSCOPY INST (INSTRUMENTS) ×3 IMPLANT
SET ARTHROSCOPY PUMP TUBE (IRRIGATION / IRRIGATOR) ×3 IMPLANT
SET BASIN LINEN APH (SET/KITS/TRAYS/PACK) ×3 IMPLANT
SUT ETHILON 3 0 FSL (SUTURE) IMPLANT
SYR 30ML LL (SYRINGE) ×3 IMPLANT
YANKAUER SUCT BULB TIP 10FT TU (MISCELLANEOUS) ×9 IMPLANT

## 2016-06-18 NOTE — Interval H&P Note (Signed)
History and Physical Interval Note:  06/18/2016 7:15 AM  Donna Moore  has presented today for surgery, with the diagnosis of CHONDROMALACIA OF RIGHT PATELLA  The various methods of treatment have been discussed with the patient and family. After consideration of risks, benefits and other options for treatment, the patient has consented to  Procedure(s): ARTHROSCOPY KNEE + CHONDROPLASTY (Right) as a surgical intervention .  The patient's history has been reviewed, patient examined, no change in status, stable for surgery.  I have reviewed the patient's chart and labs.  Questions were answered to the patient's satisfaction.     Fuller Canada

## 2016-06-18 NOTE — Anesthesia Procedure Notes (Signed)
Procedure Name: LMA Insertion Date/Time: 06/18/2016 7:38 AM Performed by: Pernell Dupre, Tifanie Gardiner A Pre-anesthesia Checklist: Patient identified, Timeout performed, Emergency Drugs available, Suction available and Patient being monitored Patient Re-evaluated:Patient Re-evaluated prior to inductionPreoxygenation: Pre-oxygenation with 100% oxygen Intubation Type: IV induction Ventilation: Mask ventilation without difficulty LMA: LMA inserted LMA Size: 4.0 Number of attempts: 1 Placement Confirmation: positive ETCO2 and breath sounds checked- equal and bilateral Tube secured with: Tape Dental Injury: Teeth and Oropharynx as per pre-operative assessment

## 2016-06-18 NOTE — Anesthesia Preprocedure Evaluation (Signed)
Anesthesia Evaluation  Patient identified by MRN, date of birth, ID band Patient awake    Reviewed: Allergy & Precautions, H&P , NPO status , Patient's Chart, lab work & pertinent test results  Airway Mallampati: II  TM Distance: >3 FB Neck ROM: full    Dental  (+) Teeth Intact   Pulmonary neg pulmonary ROS, former smoker,    breath sounds clear to auscultation       Cardiovascular hypertension, On Medications and Pt. on medications  Rhythm:regular Rate:Normal     Neuro/Psych negative neurological ROS     GI/Hepatic negative GI ROS,   Endo/Other  diabetes, GestationalMorbid obesity  Renal/GU      Musculoskeletal   Abdominal   Peds  Hematology   Anesthesia Other Findings       Reproductive/Obstetrics                             Anesthesia Physical Anesthesia Plan  ASA: II  Anesthesia Plan: General   Post-op Pain Management:    Induction: Intravenous  Airway Management Planned: LMA  Additional Equipment:   Intra-op Plan:   Post-operative Plan: Extubation in OR  Informed Consent: I have reviewed the patients History and Physical, chart, labs and discussed the procedure including the risks, benefits and alternatives for the proposed anesthesia with the patient or authorized representative who has indicated his/her understanding and acceptance.     Plan Discussed with:   Anesthesia Plan Comments:         Anesthesia Quick Evaluation

## 2016-06-18 NOTE — Discharge Instructions (Signed)
The patella was arthritic and had chondromalacia which we debrided he also had a full-thickness cartilage defect in the medial femoral condyle which was treated with abrasion arthroplasty which is a marrow stimulation procedure to encourage no cartilage growth     PATIENT INSTRUCTIONS POST-ANESTHESIA  IMMEDIATELY FOLLOWING SURGERY:  Do not drive or operate machinery for the first twenty four hours after surgery.  Do not make any important decisions for twenty four hours after surgery or while taking narcotic pain medications or sedatives.  If you develop intractable nausea and vomiting or a severe headache please notify your doctor immediately.  FOLLOW-UP:  Please make an appointment with your surgeon as instructed. You do not need to follow up with anesthesia unless specifically instructed to do so.  WOUND CARE INSTRUCTIONS (if applicable):  Keep a dry clean dressing on the anesthesia/puncture wound site if there is drainage.  Once the wound has quit draining you may leave it open to air.  Generally you should leave the bandage intact for twenty four hours unless there is drainage.  If the epidural site drains for more than 36-48 hours please call the anesthesia department.  QUESTIONS?:  Please feel free to call your physician or the hospital operator if you have any questions, and they will be happy to assist you.      Knee Arthroscopy, Care After Refer to this sheet in the next few weeks. These instructions provide you with information about caring for yourself after your procedure. Your health care provider may also give you more specific instructions. Your treatment has been planned according to current medical practices, but problems sometimes occur. Call your health care provider if you have any problems or questions after your procedure. What can I expect after the procedure? After the procedure, it is common to have:  Soreness.  Pain. Follow these instructions at home: Bathing    Do not take baths, swim, or use a hot tub until your health care provider approves. Incision care   There are many different ways to close and cover an incision, including stitches, skin glue, and adhesive strips. Follow your health care providers instructions about:  Incision care.  Bandage (dressing) changes and removal.  Incision closure removal.  Check your incision area every day for signs of infection. Watch for:  Redness, swelling, or pain.  Fluid, blood, or pus. Activity   Avoid strenuous activities for as long as directed by your health care provider.  Return to your normal activities as directed by your health care provider. Ask your health care provider what activities are safe for you.  Perform range-of-motion exercises only as directed by your health care provider.  Do not lift anything that is heavier than 10 lb (4.5 kg).  Do not drive or operate heavy machinery while taking pain medicine.  If you were given crutches, use them as directed by your health care provider. Managing pain, stiffness, and swelling   If directed, apply ice to the injured area:  Put ice in a plastic bag.  Place a towel between your skin and the bag.  Leave the ice on for 20 minutes, 2-3 times per day.  Raise the injured area above the level of your heart while you are sitting or lying down as directed by your health care provider. General instructions   Keep all follow-up visits as directed by your health care provider. This is important.  Take medicines only as directed by your health care provider.  Do not use any tobacco  products, including cigarettes, chewing tobacco, or electronic cigarettes. If you need help quitting, ask your health care provider.  If you were given compression stockings, wear them as directed by your health care provider. These stockings help prevent blood clots and reduce swelling in your legs. Contact a health care provider if:  You have severe  pain with any movement of your knee.  You notice a bad smell coming from the incision or dressing.  You have redness, swelling, or pain at the site of your incision.  You have fluid, blood, or pus coming from your incision. Get help right away if:  You develop a rash.  You have a fever.  You have difficulty breathing or have shortness of breath.  You develop pain in your calves or in the back of your knee.  You develop chest pain.  You develop numbness or tingling in your leg or foot. This information is not intended to replace advice given to you by your health care provider. Make sure you discuss any questions you have with your health care provider. Document Released: 08/23/2004 Document Revised: 07/06/2015 Document Reviewed: 01/30/2014 Elsevier Interactive Patient Education  2017 [employer].

## 2016-06-18 NOTE — Brief Op Note (Signed)
06/18/2016  8:27 AM  PATIENT:  Donna Moore  36 y.o. female  PRE-OPERATIVE DIAGNOSIS:  CHONDROMALACIA OF RIGHT PATELLA  POST-OPERATIVE DIAGNOSIS:  CHONDROMALACIA OF RIGHT PATELLA  PROCEDURE:  Procedure(s): ARTHROSCOPY KNEE + CHONDROPLASTY on right patella; abrasion arthroplasty on right femur (Right)  Surgical findings #1 the patient was stable under general anesthesia with increased lateral excursion of the patella #2 the patient had slight lateral tracking which improved with knee flexion #3 the patient had a grade 3 lesion of the lateral facet of the patella #4 she had a grade 4 lesion of the medial femoral condyle near the trochlea #5 the anterior cruciate ligament PCL medial and lateral meniscus was normal  [id number]  Knee arthroscopy dictation  The patient was identified in the preoperative holding area using 2 approved identification mechanisms. The chart was reviewed and updated. The surgical site was confirmed as RIGHT knee and marked with an indelible marker.  The patient was taken to the operating room for anesthesia. After successful  GENERAL anesthesia, ANCEF was used as IV antibiotics.  The patient was placed in the supine position with the (RIGHT) the operative extremity in an arthroscopic leg holder and the opposite extremity in a padded leg holder.  The timeout was executed.  A lateral portal was established with an 11 blade and the scope was introduced into the joint. A diagnostic arthroscopy was performed in circumferential manner examining the entire knee joint. A medial portal was established and the diagnostic arthroscopy was repeated using a probe to palpate intra-articular structures as they were encountered.   The lateral patellar facet was debrided performing a chondroplasty. This was contoured using a 90 Linvatec wand.  We then turned our attention to the medial femoral condylar lesion which was 6 mm wide by 3 mm long we performed a chondroplasty and an  abrasion arthroplasty.  The arthroscopic pump was placed on the wash mode and any excess debris was removed from the joint using suction.  60 cc of Marcaine with epinephrine was injected through the arthroscope.  The portals were closed with 3-0 nylon suture.  A sterile bandage, Ace wrap and Cryo/Cuff was placed and the Cryo/Cuff was activated. The patient was taken to the recovery room in stable condition.  SURGEON:  Surgeon(s) and Role:    * Vickki Hearing, MD - Primary  PHYSICIAN ASSISTANT:   ASSISTANTS: none   ANESTHESIA:   general  EBL:  Total I/O In: 500 [I.V.:500] Out: 5 [Blood:5]  BLOOD ADMINISTERED:none  DRAINS: none   LOCAL MEDICATIONS USED:  MARCAINE     SPECIMEN:  No Specimen  DISPOSITION OF SPECIMEN:  N/A  COUNTS:  YES  TOURNIQUET:    DICTATION: .Dragon Dictation  PLAN OF CARE: Discharge to home after PACU  PATIENT DISPOSITION:  PACU - hemodynamically stable.   Delay start of Pharmacological VTE agent (>24hrs) due to surgical blood loss or risk of bleeding: N/A

## 2016-06-18 NOTE — Transfer of Care (Signed)
Immediate Anesthesia Transfer of Care Note  Patient: Donna Moore  Procedure(s) Performed: Procedure(s): ARTHROSCOPY KNEE + CHONDROPLASTY on right patella; abrasion arthroplasty on right femur (Right)  Patient Location: PACU  Anesthesia Type:General  Level of Consciousness: awake, oriented and patient cooperative  Airway & Oxygen Therapy: Patient Spontanous Breathing and Patient connected to face mask oxygen  Post-op Assessment: Report given to RN and Post -op Vital signs reviewed and stable  Post vital signs: Reviewed and stable  Last Vitals:  Vitals:   06/18/16 0720 06/18/16 0725  BP: 105/64   Pulse:    Resp: 16 16  Temp:      Last Pain:  Vitals:   06/18/16 0631  TempSrc: Oral  PainSc: 6       Patients Stated Pain Goal: 5 (06/18/16 0631)  Complications: No apparent anesthesia complications

## 2016-06-18 NOTE — Op Note (Signed)
06/18/2016  8:27 AM  PATIENT:  Donna Moore  36 y.o. female  PRE-OPERATIVE DIAGNOSIS:  CHONDROMALACIA OF RIGHT PATELLA  POST-OPERATIVE DIAGNOSIS:  CHONDROMALACIA OF RIGHT PATELLA  PROCEDURE:  Procedure(s): ARTHROSCOPY KNEE + CHONDROPLASTY on right patella; abrasion arthroplasty on right femur (Right)  Surgical findings #1 the patient was stable under general anesthesia with increased lateral excursion of the patella #2 the patient had slight lateral tracking which improved with knee flexion #3 the patient had a grade 3 lesion of the lateral facet of the patella #4 she had a grade 4 lesion of the medial femoral condyle near the trochlea #5 the anterior cruciate ligament PCL medial and lateral meniscus was normal  [id number]  Knee arthroscopy dictation  The patient was identified in the preoperative holding area using 2 approved identification mechanisms. The chart was reviewed and updated. The surgical site was confirmed as RIGHT knee and marked with an indelible marker.  The patient was taken to the operating room for anesthesia. After successful  GENERAL anesthesia, ANCEF was used as IV antibiotics.  The patient was placed in the supine position with the (RIGHT) the operative extremity in an arthroscopic leg holder and the opposite extremity in a padded leg holder.  The timeout was executed.  A lateral portal was established with an 11 blade and the scope was introduced into the joint. A diagnostic arthroscopy was performed in circumferential manner examining the entire knee joint. A medial portal was established and the diagnostic arthroscopy was repeated using a probe to palpate intra-articular structures as they were encountered.   The lateral patellar facet was debrided performing a chondroplasty. This was contoured using a 90 Linvatec wand.  We then turned our attention to the medial femoral condylar lesion which was 6 mm wide by 3 mm long we performed a chondroplasty and an  abrasion arthroplasty.  The arthroscopic pump was placed on the wash mode and any excess debris was removed from the joint using suction.  60 cc of Marcaine with epinephrine was injected through the arthroscope.  The portals were closed with 3-0 nylon suture.  A sterile bandage, Ace wrap and Cryo/Cuff was placed and the Cryo/Cuff was activated. The patient was taken to the recovery room in stable condition.  SURGEON:  Surgeon(s) and Role:    * Jasmeen Fritsch E Tahja Liao, MD - Primary  PHYSICIAN ASSISTANT:   ASSISTANTS: none   ANESTHESIA:   general  EBL:  Total I/O In: 500 [I.V.:500] Out: 5 [Blood:5]  BLOOD ADMINISTERED:none  DRAINS: none   LOCAL MEDICATIONS USED:  MARCAINE     SPECIMEN:  No Specimen  DISPOSITION OF SPECIMEN:  N/A  COUNTS:  YES  TOURNIQUET:    DICTATION: .Dragon Dictation  PLAN OF CARE: Discharge to home after PACU  PATIENT DISPOSITION:  PACU - hemodynamically stable.   Delay start of Pharmacological VTE agent (>24hrs) due to surgical blood loss or risk of bleeding: N/A  

## 2016-06-18 NOTE — Anesthesia Postprocedure Evaluation (Signed)
Anesthesia Post Note  Patient: Donna Moore  Procedure(s) Performed: Procedure(s) (LRB): ARTHROSCOPY KNEE + CHONDROPLASTY on right patella; abrasion arthroplasty on right femur (Right)  Patient location during evaluation: PACU Anesthesia Type: General Level of consciousness: patient cooperative and awake and alert Pain management: pain level controlled Vital Signs Assessment: post-procedure vital signs reviewed and stable Respiratory status: spontaneous breathing and respiratory function stable Cardiovascular status: stable Postop Assessment: no signs of nausea or vomiting Anesthetic complications: no     Last Vitals:  Vitals:   06/18/16 0841 06/18/16 0845  BP:  (!) 103/59  Pulse: 86 82  Resp: (!) 25 20  Temp:      Last Pain:  Vitals:   06/18/16 0841  TempSrc:   PainSc: 8                  ADAMS, AMY A

## 2016-06-19 ENCOUNTER — Encounter (HOSPITAL_COMMUNITY): Payer: Self-pay | Admitting: Orthopedic Surgery

## 2016-06-23 ENCOUNTER — Ambulatory Visit (INDEPENDENT_AMBULATORY_CARE_PROVIDER_SITE_OTHER): Payer: Medicaid Other | Admitting: Orthopedic Surgery

## 2016-06-23 ENCOUNTER — Encounter: Payer: Self-pay | Admitting: Orthopedic Surgery

## 2016-06-23 DIAGNOSIS — Z9889 Other specified postprocedural states: Secondary | ICD-10-CM

## 2016-06-23 DIAGNOSIS — M2241 Chondromalacia patellae, right knee: Secondary | ICD-10-CM

## 2016-06-23 DIAGNOSIS — Z4889 Encounter for other specified surgical aftercare: Secondary | ICD-10-CM

## 2016-06-23 NOTE — Progress Notes (Signed)
Postop visit #1 status post knee arthroscopy  Chief Complaint  Patient presents with  . Follow-up    SARK, DOS 06/18/16     Operative report PRE-OPERATIVE DIAGNOSIS:  CHONDROMALACIA OF RIGHT PATELLA  POST-OPERATIVE DIAGNOSIS:  CHONDROMALACIA OF RIGHT PATELLA  PROCEDURE:  Procedure(s): ARTHROSCOPY KNEE + CHONDROPLASTY on right patella; abrasion arthroplasty on right femur (Right)  Surgical findings #1 the patient was stable under general anesthesia with increased lateral excursion of the patella #2 the patient had slight lateral tracking which improved with knee flexion #3 the patient had a grade 3 lesion of the lateral facet of the patella #4 she had a grade 4 lesion of the medial femoral condyle near the trochlea #5 the anterior cruciate ligament PCL medial and lateral meniscus was normal  [id number]   Problems issues concerned: She is concerned that when she doesn't use the crutches the knee gives   Current Meds  Medication Sig  . benazepril-hydrochlorthiazide (LOTENSIN HCT) 20-25 MG tablet Take 1 tablet by mouth daily.  Marland Kitchen. HYDROcodone-acetaminophen (NORCO/VICODIN) 5-325 MG tablet Take 1 tablet by mouth every 4 (four) hours as needed for moderate pain.  Marland Kitchen. ibuprofen (ADVIL,MOTRIN) 800 MG tablet Take 1 tablet (800 mg total) by mouth every 8 (eight) hours as needed.     The portal sites are clean dry and intact, the sutures were removed.  The calf was supple soft and the Homans sign was negative.  The patient is regaining knee flexion  Physical therapy will be started At home Continue crutches until you feel confident in the knee  Status post knee arthroscopy  Follow-up in  3-4 WEEKS

## 2016-06-23 NOTE — Patient Instructions (Signed)
Start home exercises 

## 2016-06-26 ENCOUNTER — Telehealth: Payer: Self-pay | Admitting: Orthopedic Surgery

## 2016-06-26 NOTE — Telephone Encounter (Signed)
Routing to Dr Harrison for approval 

## 2016-06-26 NOTE — Telephone Encounter (Signed)
Ibuprofen 800 tid   And norco 5 mg Q8 # 30

## 2016-06-26 NOTE — Telephone Encounter (Signed)
Patient requests refill on Hydrocodone/Acetaminophen (Norco)  5-325  mgs.   Qty  30  Sig: Take 1 tablet by mouth every 4 (four) hours as needed for moderate pain.

## 2016-06-27 ENCOUNTER — Other Ambulatory Visit: Payer: Self-pay | Admitting: *Deleted

## 2016-06-27 MED ORDER — HYDROCODONE-ACETAMINOPHEN 5-325 MG PO TABS: 1.0000 | ORAL_TABLET | Freq: Three times a day (TID) | ORAL | 0 refills | Status: DC | PRN

## 2016-06-27 MED ORDER — IBUPROFEN 800 MG PO TABS: 800.0000 mg | ORAL_TABLET | Freq: Three times a day (TID) | ORAL | 1 refills | Status: DC | PRN

## 2016-07-01 NOTE — Telephone Encounter (Signed)
DID PATIENT PICK THIS UP?

## 2016-07-15 ENCOUNTER — Ambulatory Visit: Payer: Medicaid Other | Admitting: Orthopedic Surgery

## 2016-07-18 ENCOUNTER — Ambulatory Visit (INDEPENDENT_AMBULATORY_CARE_PROVIDER_SITE_OTHER): Payer: Medicaid Other | Admitting: Orthopedic Surgery

## 2016-07-18 ENCOUNTER — Encounter: Payer: Self-pay | Admitting: Orthopedic Surgery

## 2016-07-18 DIAGNOSIS — M2241 Chondromalacia patellae, right knee: Secondary | ICD-10-CM

## 2016-07-18 DIAGNOSIS — Z9889 Other specified postprocedural states: Secondary | ICD-10-CM

## 2016-07-18 DIAGNOSIS — Z4889 Encounter for other specified surgical aftercare: Secondary | ICD-10-CM

## 2016-07-18 MED ORDER — HYDROCODONE-ACETAMINOPHEN 5-325 MG PO TABS
1.0000 | ORAL_TABLET | Freq: Three times a day (TID) | ORAL | 0 refills | Status: DC | PRN
Start: 2016-07-18 — End: 2016-09-01

## 2016-07-18 NOTE — Progress Notes (Signed)
Patient ID: Donna Moore, female   DOB: 09-24-1980, 36 y.o.   MRN: [id number]  POSTOP VISIT   Chief Complaint  Patient presents with  . Follow-up    POST OP SARK, DOS 06/18/16   The patient chondral malacia of her patella. We did a chondroplasty  She has complaints of pain in the medial and lateral infrapatellar region with occasional giving way symptoms  Her quadriceps function is very poor, her range of motion is not very good as well with only 90 of active flexion and 105 of passive flexion.  No inflammation is noted  Recommend terminal knee extensions quad sets and heel slides  We refilled her pain medication and she is to continue ibuprofen all see her in 6 weeks  Encounter Diagnoses  Name Primary?  Marland Kitchen. Aftercare following surgery Yes  . S/P right knee arthroscopy   . Chondromalacia of right patella       11:05 AM Fuller CanadaStanley Evin Chirco, MD 07/18/2016

## 2016-07-18 NOTE — Patient Instructions (Signed)
Continue knee exercises   Heel slides Quad sets  And terminal knee extensions with towel under the knee

## 2016-08-04 ENCOUNTER — Telehealth: Payer: Self-pay | Admitting: Orthopedic Surgery

## 2016-08-04 NOTE — Telephone Encounter (Signed)
WE HAVE TO STOP THE OPIOIDS SHE NEEDS TO HAVE IBUPROFEN OR TYLENOL NOW

## 2016-08-04 NOTE — Telephone Encounter (Signed)
Routing to Dr Harrison for approval 

## 2016-08-04 NOTE — Telephone Encounter (Signed)
Patient requests refill:  HYDROcodone-acetaminophen (NORCO/VICODIN) 5-325 MG tablet 26 tablet

## 2016-08-04 NOTE — Telephone Encounter (Signed)
PATIENT AWARE

## 2016-09-01 ENCOUNTER — Ambulatory Visit (INDEPENDENT_AMBULATORY_CARE_PROVIDER_SITE_OTHER): Payer: Medicaid Other | Admitting: Orthopedic Surgery

## 2016-09-01 ENCOUNTER — Encounter: Payer: Self-pay | Admitting: Orthopedic Surgery

## 2016-09-01 DIAGNOSIS — Z4889 Encounter for other specified surgical aftercare: Secondary | ICD-10-CM

## 2016-09-01 DIAGNOSIS — Z9889 Other specified postprocedural states: Secondary | ICD-10-CM

## 2016-09-01 DIAGNOSIS — M2241 Chondromalacia patellae, right knee: Secondary | ICD-10-CM

## 2016-09-01 NOTE — Progress Notes (Signed)
Postoperative visit  Chief Complaint  Patient presents with  . Follow-up    SARK, DOS 06/18/16    Patient had a chondroplasty. She understood going in this may not help. She says that despite her best efforts of exercise she is basically getting the same symptoms again with aching pain in the front of her knee  She does take ibuprofen to control the pain  She did regain her range of motion her knee Is stable  We released her with the understanding that she will have some continued discomfort in that knee

## 2018-04-21 ENCOUNTER — Encounter: Payer: Self-pay | Admitting: Orthopedic Surgery

## 2018-04-21 ENCOUNTER — Ambulatory Visit (INDEPENDENT_AMBULATORY_CARE_PROVIDER_SITE_OTHER): Payer: Medicaid Other

## 2018-04-21 ENCOUNTER — Ambulatory Visit: Payer: Medicaid Other | Admitting: Orthopedic Surgery

## 2018-04-21 VITALS — BP 171/119 | HR 127 | Ht 67.0 in | Wt 232.0 lb

## 2018-04-21 DIAGNOSIS — M5410 Radiculopathy, site unspecified: Secondary | ICD-10-CM

## 2018-04-21 MED ORDER — GABAPENTIN 100 MG PO CAPS: 100.0000 mg | ORAL_CAPSULE | Freq: Three times a day (TID) | ORAL | 2 refills | Status: DC

## 2018-04-21 MED ORDER — PREDNISONE 10 MG (48) PO TBPK: ORAL_TABLET | Freq: Every day | ORAL | 0 refills | Status: DC

## 2018-04-21 NOTE — Progress Notes (Signed)
NEW PROBLEM/ESTABLISHED PATIENT  Chief Complaint  Patient presents with  . Knee Pain    right     38 year old female comes in complaining of right knee pain radiating to her right hip she denies back pain.  She has had pain for several weeks now with no evidence of trauma pain is dull it aches it runs from her knee to her hip although she says she has no back pain  No trauma  No bowel or bladder dysfunction  She says she cannot sleep at night due to the severity of the pain  She had an arthroscopy of the knee about a year and a half ago 06/18/2016  8:27 AM  PATIENT:  Donna Moore  38 y.o. female  PRE-OPERATIVE DIAGNOSIS:  CHONDROMALACIA OF RIGHT PATELLA  POST-OPERATIVE DIAGNOSIS:  CHONDROMALACIA OF RIGHT PATELLA  PROCEDURE:  Procedure(s): ARTHROSCOPY KNEE + CHONDROPLASTY on right patella; abrasion arthroplasty on right femur (Right)  Surgical findings #1 the patient was stable under general anesthesia with increased lateral excursion of the patella #2 the patient had slight lateral tracking which improved with knee flexion #3 the patient had a grade 3 lesion of the lateral facet of the patella #4 she had a grade 4 lesion of the medial femoral condyle near the trochlea #5 the anterior cruciate ligament PCL medial and lateral meniscus was normal  [id number]     Review of Systems  Constitutional: Negative for chills and fever.  Gastrointestinal: Negative.   Genitourinary: Negative.   Musculoskeletal: Negative for back pain.  Neurological: Negative for tingling, sensory change, focal weakness and weakness.     Past Medical History:  Diagnosis Date  . Accessory skin tags 08/22/2015  . Benign essential hypertension with delivery 01/26/2013  . Constipation 01/28/2013  . Gestational diabetes    gestational  . Heart murmur   . Incisional infection 01/26/2013  . Menstrual cramps 08/22/2015  . Patient desires pregnancy 08/22/2015  . Pregnancy 06/21/2012  . Pregnancy induced  hypertension     Past Surgical History:  Procedure Laterality Date  . CESAREAN SECTION N/A 01/19/2013   Procedure: CESAREAN SECTION;  Surgeon: Tilda Burrow, MD;  Location: [facility];  Service: Obstetrics;  Laterality: N/A;  . cyst drained     Emergency surg, and in hospital for 1 week.;pilonidal  . KNEE ARTHROSCOPY Right 06/18/2016   Procedure: ARTHROSCOPY KNEE + CHONDROPLASTY on right patella; abrasion arthroplasty on right femur;  Surgeon: Vickki Hearing, MD;  Location: [facility];  Service: Orthopedics;  Laterality: Right;    Family History  Problem Relation Age of Onset  . Hypertension Mother   . Diabetes Mother   . Hypertension Father   . Diabetes Brother    Social History   Tobacco Use  . Smoking status: Former Smoker    Years: 1.00    Types: Cigars    Last attempt to quit: 01/11/2012    Years since quitting: 6.2  . Smokeless tobacco: Never Used  . Tobacco comment: 2-3 times week  Substance Use Topics  . Alcohol use: No  . Drug use: No    No Known Allergies  Current Meds  Medication Sig  . amLODipine (NORVASC) 5 MG tablet Take 5 mg by mouth daily.  . benazepril (LOTENSIN) 40 MG tablet Take 40 mg by mouth daily.  . chlorthalidone (HYGROTON) 25 MG tablet Take 25 mg by mouth daily.    BP (!) 171/119   Pulse (!) 127   Ht 5\' 7"  (1.702 m)   Wt  232 lb (105.2 kg)   LMP 04/12/2018   BMI 36.34 kg/m   Physical Exam Vitals signs reviewed.  Constitutional:      Appearance: Normal appearance. She is well-developed.  Neurological:     Mental Status: She is alert and oriented to person, place, and time.  Psychiatric:        Attention and Perception: Attention normal.        Mood and Affect: Mood and affect normal.        Speech: Speech normal.        Behavior: Behavior normal.        Thought Content: Thought content normal.        Judgment: Judgment normal.     Right Knee Exam   Muscle Strength  The patient has normal right knee strength.  Tenderness  The  patient is experiencing tenderness in the patella.  Range of Motion  Extension: normal  Flexion: normal   Tests  McMurray:  Medial - negative Lateral - negative Varus: negative Valgus: negative Drawer:  Anterior - negative    Posterior - negative  Other  Erythema: absent Scars: absent Sensation: normal Pulse: present Swelling: none  Comments:  Positive straight leg raise on the right tenderness in the popliteal fossa hamstring up to the buttock area with lower back tenderness increased muscle tension on the right   Left Knee Exam   Muscle Strength  The patient has normal left knee strength.  Tenderness  The patient is experiencing no tenderness.   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  . Radicular pain of right lower extremity Yes    PLAN: (Rx., injectx, surgery, frx, mri/ct) Gabapentin and prednisone heating pad follow-up 2 weeks    Meds ordered this encounter  Medications  . gabapentin (NEURONTIN) 100 MG capsule    Sig: Take 1 capsule (100 mg total) by mouth 3 (three) times daily.    Dispense:  90 capsule    Refill:  2  . predniSONE (STERAPRED UNI-PAK 48 TAB) 10 MG (48) TBPK tablet    Sig: Take by mouth daily.    Dispense:  48 tablet    Refill:  0    Fuller Canada, MD  04/21/2018 4:36 PM

## 2018-04-21 NOTE — Patient Instructions (Addendum)
Use heat  Stop ibuprofen    Sciatica  Sciatica is pain, numbness, weakness, or tingling along your sciatic nerve. The sciatic nerve starts in the lower back and goes down the back of each leg. Sciatica happens when this nerve is pinched or has pressure put on it. Sciatica usually goes away on its own or with treatment. Sometimes, sciatica may keep coming back (recur). Follow these instructions at home: Medicines  Take over-the-counter and prescription medicines only as told by your doctor.  Do not drive or use heavy machinery while taking prescription pain medicine. Managing pain  If directed, put ice on the affected area. ? Put ice in a plastic bag. ? Place a towel between your skin and the bag. ? Leave the ice on for 20 minutes, 2-3 times a day.  After icing, apply heat to the affected area before you exercise or as often as told by your doctor. Use the heat source that your doctor tells you to use, such as a moist heat pack or a heating pad. ? Place a towel between your skin and the heat source. ? Leave the heat on for 20-30 minutes. ? Remove the heat if your skin turns bright red. This is especially important if you are unable to feel pain, heat, or cold. You may have a greater risk of getting burned. Activity  Return to your normal activities as told by your doctor. Ask your doctor what activities are safe for you. ? Avoid activities that make your sciatica worse.  Take short rests during the day. Rest in a lying or standing position. This is usually better than sitting to rest. ? When you rest for a long time, do some physical activity or stretching between periods of rest. ? Avoid sitting for a long time without moving. Get up and move around at least one time each hour.  Exercise and stretch regularly, as told by your doctor.  Do not lift anything that is heavier than 10 lb (4.5 kg) while you have symptoms of sciatica. ? Avoid lifting heavy things even when you do not have  symptoms. ? Avoid lifting heavy things over and over.  When you lift objects, always lift in a way that is safe for your body. To do this, you should: ? Bend your knees. ? Keep the object close to your body. ? Avoid twisting. General instructions  Use good posture. ? Avoid leaning forward when you are sitting. ? Avoid hunching over when you are standing.  Stay at a healthy weight.  Wear comfortable shoes that support your feet. Avoid wearing high heels.  Avoid sleeping on a mattress that is too soft or too hard. You might have less pain if you sleep on a mattress that is firm enough to support your back.  Keep all follow-up visits as told by your doctor. This is important. Contact a doctor if:  You have pain that: ? Wakes you up when you are sleeping. ? Gets worse when you lie down. ? Is worse than the pain you have had in the past. ? Lasts longer than 4 weeks.  You lose weight for without trying. Get help right away if:  You cannot control when you pee (urinate) or poop (have a bowel movement).  You have weakness in any of these areas and it gets worse. ? Lower back. ? Lower belly (pelvis). ? Butt (buttocks). ? Legs.  You have redness or swelling of your back.  You have a burning feeling when  you pee. This information is not intended to replace advice given to you by your health care provider. Make sure you discuss any questions you have with your health care provider. Document Released: 11/13/2007 Document Revised: 07/12/2015 Document Reviewed: 10/13/2014 Elsevier Interactive Patient Education  2019 Elsevier Inc.  Radicular Pain Radicular pain is a type of pain that spreads from your back or neck along a spinal nerve. Spinal nerves are nerves that leave the spinal cord and go to the muscles. Radicular pain is sometimes called radiculopathy, radiculitis, or a pinched nerve. When you have this type of pain, you may also have weakness, numbness, or tingling in the area  of your body that is supplied by the nerve. The pain may feel sharp and burning. Depending on which spinal nerve is affected, the pain may occur in the:  Neck area (cervical radicular pain). You may also feel pain, numbness, weakness, or tingling in the arms.  Mid-spine area (thoracic radicular pain). You would feel this pain in the back and chest. This type is rare.  Lower back area (lumbar radicular pain). You would feel this pain as low back pain. You may feel pain, numbness, weakness, or tingling in the buttocks or legs. Sciatica is a type of lumbar radicular pain that shoots down the back of the leg. Radicular pain occurs when one of the spinal nerves becomes irritated or squeezed (compressed). It is often caused by something pushing on a spinal nerve, such as one of the bones of the spine (vertebrae) or one of the round cushions between vertebrae (intervertebral disks). This can result from:  An injury.  Wear and tear or aging of a disk.  The growth of a bone spur that pushes on the nerve. Radicular pain often goes away when you follow instructions from your health care provider for relieving pain at home. Follow these instructions at home: Managing pain      If directed, put ice on the affected area: ? Put ice in a plastic bag. ? Place a towel between your skin and the bag. ? Leave the ice on for 20 minutes, 2-3 times a day.  If directed, apply heat to the affected area as often as told by your health care provider. Use the heat source that your health care provider recommends, such as a moist heat pack or a heating pad. ? Place a towel between your skin and the heat source. ? Leave the heat on for 20-30 minutes. ? Remove the heat if your skin turns bright red. This is especially important if you are unable to feel pain, heat, or cold. You may have a greater risk of getting burned. Activity   Do not sit or rest in bed for long periods of time.  Try to stay as active as  possible. Ask your health care provider what type of exercise or activity is best for you.  Avoid activities that make your pain worse, such as bending and lifting.  Do not lift anything that is heavier than 10 lb (4.5 kg), or the limit that you are told, until your health care provider says that it is safe.  Practice using proper technique when lifting items. Proper lifting technique involves bending your knees and rising up.  Do strength and range-of-motion exercises only as told by your health care provider or physical therapist. General instructions  Take over-the-counter and prescription medicines only as told by your health care provider.  Pay attention to any changes in your symptoms.  Keep  all follow-up visits as told by your health care provider. This is important. ? Your health care provider may send you to a physical therapist to help with this pain. Contact a health care provider if:  Your pain and other symptoms get worse.  Your pain medicine is not helping.  Your pain has not improved after a few weeks of home care.  You have a fever. Get help right away if:  You have severe pain, weakness, or numbness.  You have difficulty with bladder or bowel control. Summary  Radicular pain is a type of pain that spreads from your back or neck along a spinal nerve.  When you have radicular pain, you may also have weakness, numbness, or tingling in the area of your body that is supplied by the nerve.  The pain may feel sharp or burning.  Radicular pain may be treated with ice, heat, medicines, or physical therapy. This information is not intended to replace advice given to you by your health care provider. Make sure you discuss any questions you have with your health care provider. Document Released: 03/13/2004 Document Revised: 08/18/2017 Document Reviewed: 08/18/2017 Elsevier Interactive Patient Education  2019 [employer].

## 2018-05-05 ENCOUNTER — Ambulatory Visit: Payer: Medicaid Other | Admitting: Orthopedic Surgery

## 2018-05-09 ENCOUNTER — Telehealth: Payer: Self-pay | Admitting: Orthopedic Surgery

## 2018-05-09 NOTE — Telephone Encounter (Signed)
CANCELLED APPT  BEC. OF COVID19   CALL OFFICE FOR TELEMED VISIT

## 2018-05-10 ENCOUNTER — Ambulatory Visit: Payer: Medicaid Other | Admitting: Orthopedic Surgery

## 2018-05-10 ENCOUNTER — Telehealth: Payer: Self-pay | Admitting: Orthopedic Surgery

## 2018-05-10 DIAGNOSIS — M5410 Radiculopathy, site unspecified: Secondary | ICD-10-CM

## 2018-05-10 MED ORDER — HYDROCODONE-ACETAMINOPHEN 5-325 MG PO TABS: 1.0000 | ORAL_TABLET | Freq: Four times a day (QID) | ORAL | 0 refills | Status: AC | PRN

## 2018-05-10 MED ORDER — IBUPROFEN 800 MG PO TABS: 800.0000 mg | ORAL_TABLET | Freq: Three times a day (TID) | ORAL | 1 refills | Status: DC | PRN

## 2018-05-10 NOTE — Telephone Encounter (Signed)
38 year old female with radicular pain on the right leg  Treated with prednisone Dosepak ibuprofen and gabapentin  Patient says she is dealing with the best she can still having symptoms in the right lower extremity  Medications were updated  Meds ordered this encounter  Medications  . ibuprofen (ADVIL,MOTRIN) 800 MG tablet    Sig: Take 1 tablet (800 mg total) by mouth every 8 (eight) hours as needed.    Dispense:  90 tablet    Refill:  1  . HYDROcodone-acetaminophen (NORCO) 5-325 MG tablet    Sig: Take 1 tablet by mouth every 6 (six) hours as needed for up to 7 days for moderate pain.    Dispense:  28 tablet    Refill:  0   Refills were done as above she will continue gabapentin and I will call her back in 4 weeks to see how things are going  Patient will need an MRI of her lumbar spine to evaluate her radicular pain in the right leg

## 2018-05-13 ENCOUNTER — Telehealth: Payer: Self-pay | Admitting: Orthopedic Surgery

## 2018-05-13 NOTE — Telephone Encounter (Signed)
Pharmacist from [employer] [facility] wants ok to fill Hydrocodone.  She has prescription for Tramadol from her PCP Dr. Olena Leatherwood.  Please advise the pharmacist  Thanks

## 2018-05-14 NOTE — Telephone Encounter (Signed)
Once the tramadol runs out she can get the hydrocodone filled

## 2018-05-14 NOTE — Telephone Encounter (Signed)
I called pharmacist to advise.

## 2018-05-31 ENCOUNTER — Telehealth: Payer: Self-pay | Admitting: Orthopedic Surgery

## 2018-05-31 ENCOUNTER — Other Ambulatory Visit: Payer: Self-pay | Admitting: Orthopedic Surgery

## 2018-05-31 NOTE — Telephone Encounter (Signed)
Sure she can have a pep pad for knee exercises

## 2018-05-31 NOTE — Telephone Encounter (Signed)
I spoke to patient and she states her back and leg are painful. I told her the knee exercises not likely to help much, but we can send to her if she would like. She said that is not necessary. I have told her walking may help her with the back and leg. She has voiced understanding.

## 2018-05-31 NOTE — Telephone Encounter (Signed)
Patient requests refill on Hydrocodone/Acetaminophen 5-325  Mgs.   Qty  28  Sig: Take 1 tablet by mouth every 6 (six) hours as needed for up to 7 days for moderate pain.  Patient states she uses [facility] in [place]

## 2018-05-31 NOTE — Telephone Encounter (Signed)
Patient called again and said that she is not and could not take the tramadol, due to nausea.  She says she has nothing now.  She uses [facility].  Is requesting the hydrocodone rx if possible, now.  She says she did get the Rx hydrocodone filled on 05/22/18 but has no more.  Can you call her tomorrow to clarify/explain. I am assuming Dr Romeo Apple meant is that when tramadol Rx would have been gone is when they can refill, but I was not sure I would be telling her correctly. Thanks.

## 2018-05-31 NOTE — Telephone Encounter (Signed)
Patent wants to know if there any exercises that she can do to help with her knee pain.  Thanks

## 2018-06-02 NOTE — Telephone Encounter (Signed)
No opioids. ?

## 2018-06-02 NOTE — Telephone Encounter (Signed)
I spoke to patient to advise and she has voiced understanding

## 2018-06-02 NOTE — Telephone Encounter (Signed)
I called left message for her to call back Dr Romeo Apple wants her to use Gabapentin and Tramadol (which was given to her by PCP)  He does not prescribe long term Hydrocodone for patients.

## 2018-07-13 ENCOUNTER — Telehealth: Payer: Self-pay | Admitting: Orthopedic Surgery

## 2018-07-13 DIAGNOSIS — M5410 Radiculopathy, site unspecified: Secondary | ICD-10-CM

## 2018-07-13 NOTE — Telephone Encounter (Signed)
Thu called and said when she had her phone visit with Dr Romeo Apple on 05/10/18, he told her that she would need to have an MRI of her lumbar spine.  She called this afternoon stating that she wanted to go  ahead with MRI since the radiology facilities have started to reopen.    Would you please schedule this for her and then let her know when it is scheduled?  Not sure if Dr Romeo Apple was to call her with results or have the patient come in to the office  Thanks so much

## 2018-07-13 NOTE — Telephone Encounter (Signed)
Patient will need an MRI of her lumbar spine to evaluate her radicular pain in the right leg per Dr Romeo Apple I have called to screen her no metal but she is claustro, so will send to [facility] imaging she is aware they will call her.

## 2018-08-12 ENCOUNTER — Telehealth: Payer: Self-pay | Admitting: Radiology

## 2018-08-12 NOTE — Telephone Encounter (Signed)
This is error,  Have called to correct Nikki A. corrected this for me.

## 2018-08-12 NOTE — Telephone Encounter (Signed)
-----   Message from Sandre Kitty sent at 08/12/2018  1:19 PM EDT ----- Regarding: Prior Authorization Good afternoon.    This patient is scheduled for a MRI at our office on July 7th.  The authorization on file is for a different facility.   Is this patient supposed to be scheduled with [place]?  If so, please have the authorization updated  to our location.    If she is to be having this imaging dones elsewhere, please let me know so I can cancel the appointment.   GUR:[id number]  CPT:  [id number] Case# [id number] SITE:   [address] Tax ID: [id number]  Thank you, Sandre Kitty

## 2018-08-18 ENCOUNTER — Telehealth: Payer: Self-pay

## 2018-08-18 DIAGNOSIS — M5410 Radiculopathy, site unspecified: Secondary | ICD-10-CM

## 2018-08-18 NOTE — Telephone Encounter (Signed)
Patient left message on voicemail asking if she could get her MRI place changed. She is asking for a return call.  Please call and advise

## 2018-08-18 NOTE — Telephone Encounter (Signed)
Currently at [place] where does she want to go / she wants to go to [facility], told her this is closed scanner, she states that is fine, will do this tomorrow.

## 2018-08-19 ENCOUNTER — Telehealth: Payer: Self-pay | Admitting: Radiology

## 2018-08-19 NOTE — Telephone Encounter (Signed)
Have gotten facility changed and reordered the scan [id number] since patient has decided she is no longer claustrophobic

## 2018-08-19 NOTE — Telephone Encounter (Signed)
[facility] / July 7th at 8 am arrive 730, patient will call to cancel the MRI at [place].

## 2018-08-24 ENCOUNTER — Ambulatory Visit (HOSPITAL_COMMUNITY): Payer: Medicaid Other

## 2018-08-24 ENCOUNTER — Other Ambulatory Visit: Payer: Self-pay

## 2018-08-26 ENCOUNTER — Other Ambulatory Visit: Payer: Self-pay

## 2018-08-26 ENCOUNTER — Ambulatory Visit (HOSPITAL_COMMUNITY)
Admission: RE | Admit: 2018-08-26 | Discharge: 2018-08-26 | Disposition: A | Discharge status: Home / Self Care | Payer: Medicaid Other | Source: Ambulatory Visit | Attending: Orthopedic Surgery | Admitting: Orthopedic Surgery

## 2018-08-26 DIAGNOSIS — M5410 Radiculopathy, site unspecified: Secondary | ICD-10-CM | POA: Insufficient documentation

## 2018-09-03 ENCOUNTER — Telehealth: Payer: Self-pay | Admitting: Radiology

## 2018-09-03 DIAGNOSIS — M5410 Radiculopathy, site unspecified: Secondary | ICD-10-CM

## 2018-09-03 NOTE — Telephone Encounter (Signed)
-----   Message from Carole Civil, MD sent at 09/02/2018  3:55 PM EDT ----- I gave her the results   She needs to see [place] [facility]   Any day is ok

## 2019-07-25 ENCOUNTER — Other Ambulatory Visit: Payer: Self-pay

## 2019-07-25 ENCOUNTER — Ambulatory Visit
Admission: EM | Admit: 2019-07-25 | Discharge: 2019-07-25 | Disposition: A | Discharge status: Home / Self Care | Payer: Medicaid Other | Attending: Emergency Medicine | Admitting: Emergency Medicine

## 2019-07-25 DIAGNOSIS — M5442 Lumbago with sciatica, left side: Secondary | ICD-10-CM | POA: Diagnosis not present

## 2019-07-25 MED ORDER — CYCLOBENZAPRINE HCL 10 MG PO TABS: 10.0000 mg | ORAL_TABLET | Freq: Every day | ORAL | 0 refills | Status: DC

## 2019-07-25 MED ORDER — PREDNISONE 20 MG PO TABS: 20.0000 mg | ORAL_TABLET | Freq: Two times a day (BID) | ORAL | 0 refills | Status: AC

## 2019-07-25 NOTE — ED Provider Notes (Signed)
[place]   [id number] 07/25/19 Arrival Time: 4098  CC: Back PAIN  SUBJECTIVE: History from: patient. Donna Moore is a 39 y.o. female complains of LT low back pain that began 1-2 days.  Denies a precipitating event or specific injury.  Localizes the pain to the LT low back.  Describes the pain as intermittent and grabbing/"catching" in character.  Has tried OTC medications without relief.  Denies aggravating factors. Denies similar symptoms in the past.  Denies fever, chills, erythema, ecchymosis, effusion, weakness, numbness and tingling, saddle paresthesias, loss of bowel or bladder function.      ROS: As per HPI.  All other pertinent ROS negative.     Past Medical History:  Diagnosis Date  . Accessory skin tags 08/22/2015  . Benign essential hypertension with delivery 01/26/2013  . Constipation 01/28/2013  . Gestational diabetes    gestational  . Heart murmur   . Incisional infection 01/26/2013  . Menstrual cramps 08/22/2015  . Patient desires pregnancy 08/22/2015  . Pregnancy 06/21/2012  . Pregnancy induced hypertension    Past Surgical History:  Procedure Laterality Date  . CESAREAN SECTION N/A 01/19/2013   Procedure: CESAREAN SECTION;  Surgeon: Jonnie Kind, MD;  Location: [facility];  Service: Obstetrics;  Laterality: N/A;  . cyst drained     Emergency surg, and in hospital for 1 week.;pilonidal  . KNEE ARTHROSCOPY Right 06/18/2016   Procedure: ARTHROSCOPY KNEE + CHONDROPLASTY on right patella; abrasion arthroplasty on right femur;  Surgeon: Carole Civil, MD;  Location: [facility];  Service: Orthopedics;  Laterality: Right;   No Known Allergies No current facility-administered medications on file prior to encounter.   Current Outpatient Medications on File Prior to Encounter  Medication Sig Dispense Refill  . amLODipine (NORVASC) 5 MG tablet Take 5 mg by mouth daily.    . benazepril (LOTENSIN) 40 MG tablet Take 40 mg by mouth daily.    .  benazepril-hydrochlorthiazide (LOTENSIN HCT) 20-25 MG tablet Take 1 tablet by mouth daily.    . chlorthalidone (HYGROTON) 25 MG tablet Take 25 mg by mouth daily.    Marland Kitchen gabapentin (NEURONTIN) 100 MG capsule Take 1 capsule (100 mg total) by mouth 3 (three) times daily. 90 capsule 2  . HYDROcodone-acetaminophen (NORCO/VICODIN) 5-325 MG tablet TAKE 1 TABLET BY MOUTH EVERY 6 HOURS AS NEEDED FOR UP TO 7 DAYS FOR MODERATE PAIN    . ibuprofen (ADVIL,MOTRIN) 800 MG tablet Take 1 tablet (800 mg total) by mouth every 8 (eight) hours as needed. 90 tablet 1   Social History   Socioeconomic History  . Marital status: Single    Spouse name: Not on file  . Number of children: Not on file  . Years of education: Not on file  . Highest education level: Not on file  Occupational History  . Not on file  Tobacco Use  . Smoking status: Former Smoker    Years: 1.00    Types: Cigars    Quit date: 01/11/2012    Years since quitting: 7.5  . Smokeless tobacco: Never Used  . Tobacco comment: 2-3 times week  Substance and Sexual Activity  . Alcohol use: No  . Drug use: No  . Sexual activity: Yes    Birth control/protection: None  Other Topics Concern  . Not on file  Social History Narrative  . Not on file   Social Determinants of Health   Financial Resource Strain:   . Difficulty of Paying Living Expenses:   Food Insecurity:   .  Worried About Programme researcher, broadcasting/film/video in the Last Year:   . Barista in the Last Year:   Transportation Needs:   . Freight forwarder (Medical):   Marland Kitchen Lack of Transportation (Non-Medical):   Physical Activity:   . Days of Exercise per Week:   . Minutes of Exercise per Session:   Stress:   . Feeling of Stress :   Social Connections:   . Frequency of Communication with Friends and Family:   . Frequency of Social Gatherings with Friends and Family:   . Attends Religious Services:   . Active Member of Clubs or Organizations:   . Attends Banker Meetings:    Marland Kitchen Marital Status:   Intimate Partner Violence:   . Fear of Current or Ex-Partner:   . Emotionally Abused:   Marland Kitchen Physically Abused:   . Sexually Abused:    Family History  Problem Relation Age of Onset  . Hypertension Mother   . Diabetes Mother   . Hypertension Father   . Diabetes Brother     OBJECTIVE:  Vitals:   07/25/19 1502  BP: (!) 152/92  Pulse: (!) 116  Resp: 17  Temp: 99.8 F (37.7 C)  TempSrc: Oral  SpO2: 98%    General appearance: ALERT; in no acute distress.  Head: NCAT Lungs: Normal respiratory effort; CTAB CV: RRR Musculoskeletal: Back Inspection: Skin warm, dry, clear and intact without obvious erythema, effusion, or ecchymosis.  Palpation: TTP over LT low back ROM: FROM active and passive Strength: 5/5 shld abduction, 5/5 shld adduction, 5/5 elbow flexion, 5/5 elbow extension, 5/5 grip strength, 5/5 hip flexion, 5/5 hip extension Skin: warm and dry Neurologic: Ambulates without difficulty; Sensation intact about the upper/ lower extremities Psychological: alert and cooperative; normal mood and affect   ASSESSMENT & PLAN:  1. Acute left-sided low back pain with left-sided sciatica     Meds ordered this encounter  Medications  . predniSONE (DELTASONE) 20 MG tablet    Sig: Take 1 tablet (20 mg total) by mouth 2 (two) times daily with a meal for 5 days.    Dispense:  10 tablet    Refill:  0    Order Specific Question:   Supervising Provider    Answer:   Eustace Moore [[id number]]  . cyclobenzaprine (FLEXERIL) 10 MG tablet    Sig: Take 1 tablet (10 mg total) by mouth at bedtime.    Dispense:  15 tablet    Refill:  0    Order Specific Question:   Supervising Provider    Answer:   Eustace Moore [[id number]]    Continue conservative management of rest, ice, and gentle stretches Prednisone prescribed.  Take as directed and to completion Take cyclobenzaprine at nighttime for symptomatic relief. Avoid driving or operating heavy machinery while  using medication. Follow up with PCP if symptoms persist Return or go to the ER if you have any new or worsening symptoms (fever, chills, chest pain, abdominal pain, changes in bowel or bladder habits, pain radiating into lower legs, etc...)    Reviewed expectations re: course of current medical issues. Questions answered. Outlined signs and symptoms indicating need for more acute intervention. Patient verbalized understanding. After Visit Summary given.    Rennis Harding, PA-C 07/25/19 1515

## 2019-07-25 NOTE — Discharge Instructions (Signed)
Continue conservative management of rest, ice, and gentle stretches Prednisone prescribed.  Take as directed and to completion Take cyclobenzaprine at nighttime for symptomatic relief. Avoid driving or operating heavy machinery while using medication. Follow up with PCP if symptoms persist Return or go to the ER if you have any new or worsening symptoms (fever, chills, chest pain, abdominal pain, changes in bowel or bladder habits, pain radiating into lower legs, etc...)  

## 2019-07-25 NOTE — ED Triage Notes (Signed)
Pt presents with left lower back pain

## 2019-10-20 IMAGING — MR MRI LUMBAR SPINE WITHOUT CONTRAST
4 of 5 series · 14 of 48 positions shown · non-contrast
Comparison: Prior radiographs from 04/21/2018.

CLINICAL DATA: Initial evaluation for low back pain extending into
the right lower extremity for several months.

EXAM:
MRI LUMBAR SPINE WITHOUT CONTRAST
TECHNIQUE: Multiplanar, multisequence MR imaging of the lumbar spine was
performed. No intravenous contrast was administered.

[Series 3: T2 · sagittal · 4.0mm · 0.75mm/px · 5 of 15 slices shown (1 of 2)]
[im 1/15]
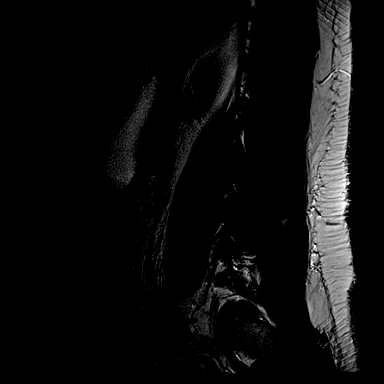
[im 3/15]
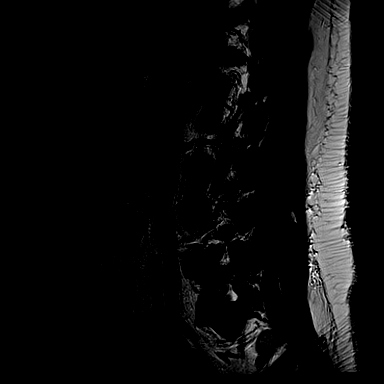
[im 6/15]
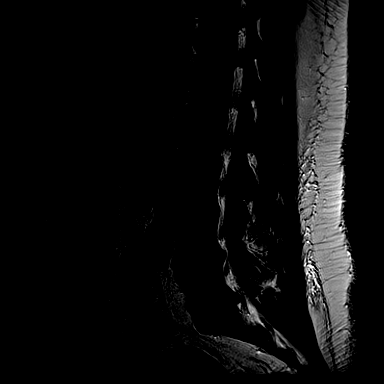
[im 9/15]
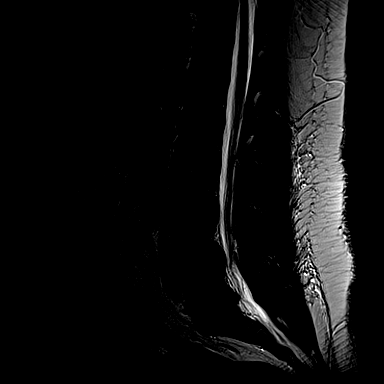
[im 15/15]
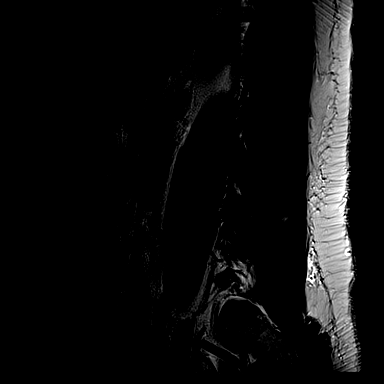

[Series 4: T1 · sagittal · 4.0mm · 0.38mm/px · 3 of 15 slices shown (1 of 2)]
[im 3/15]
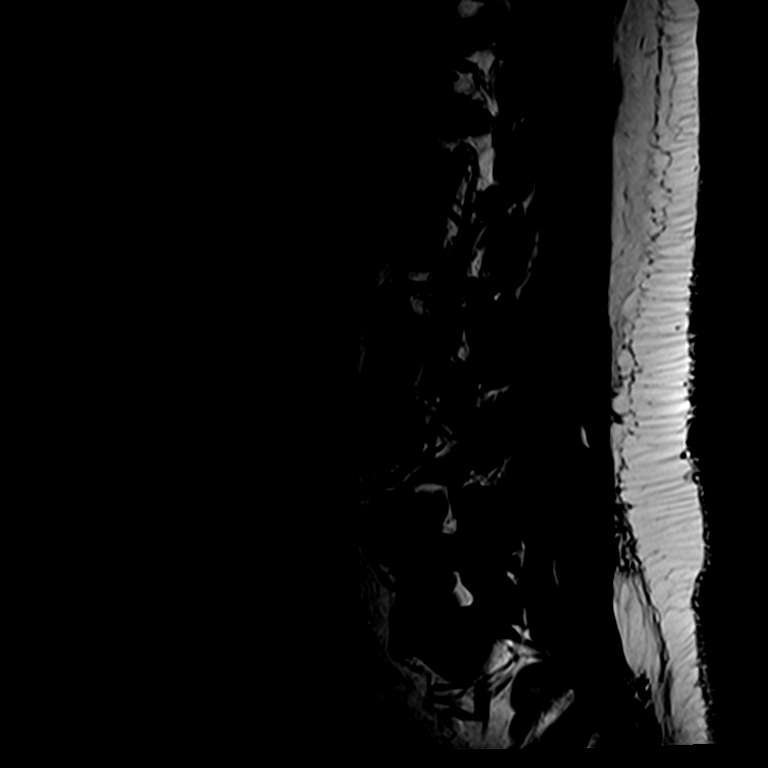
[im 9/15]
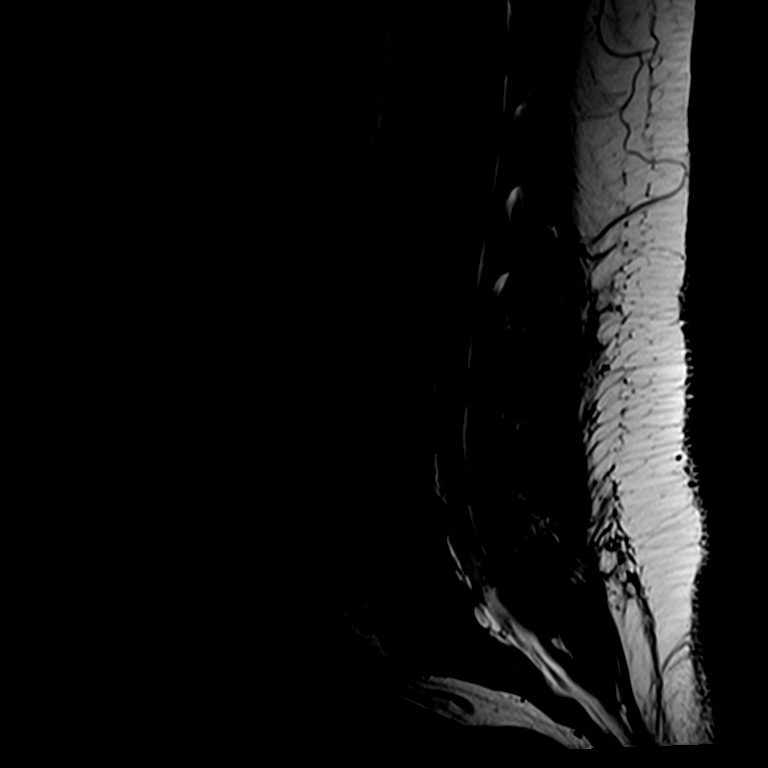
[im 15/15]
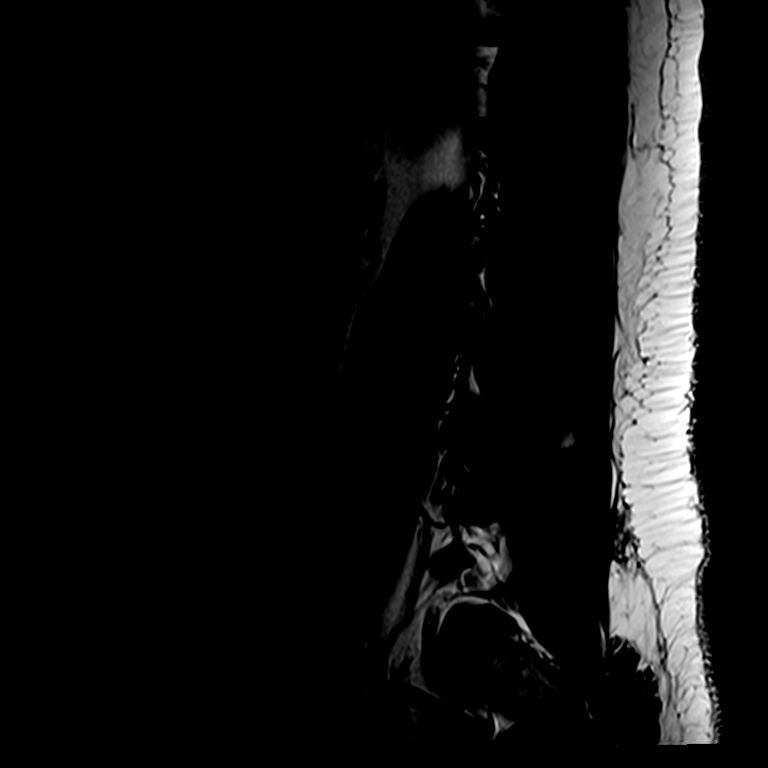

[Series 6: T2 · axial · 4.0mm · 0.24mm/px · z∈[-81,+51]mm · 3 of 40 slices shown (2 of 2)]
[im 6/40]
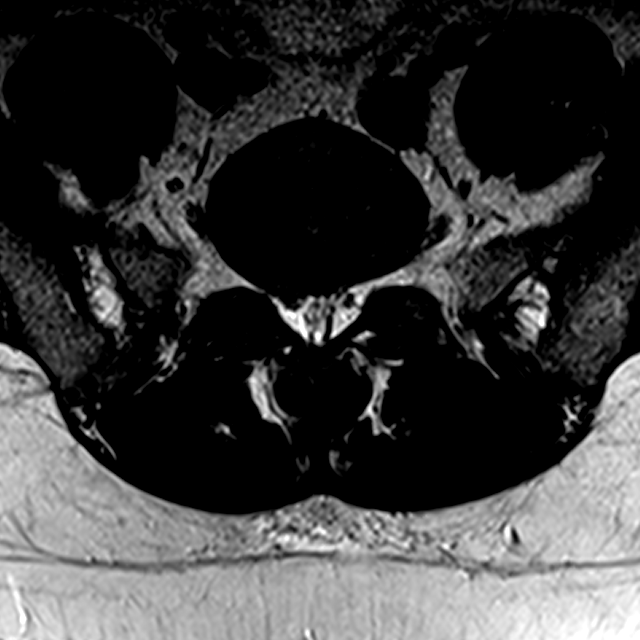
[im 20/40]
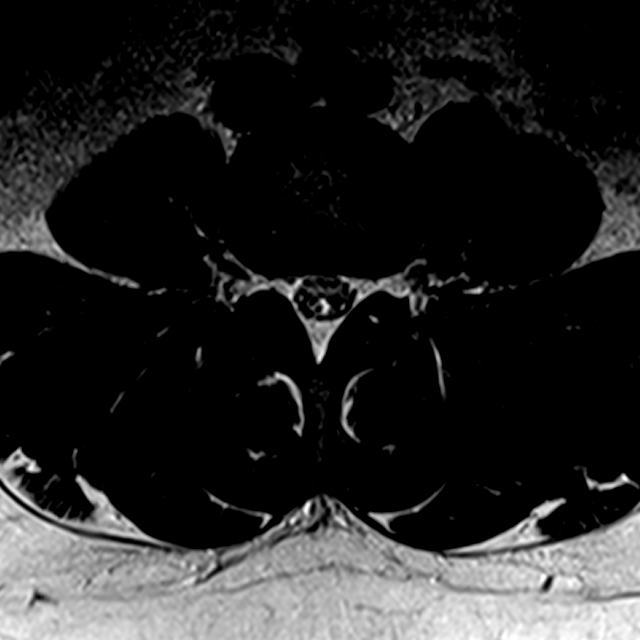
[im 34/40]
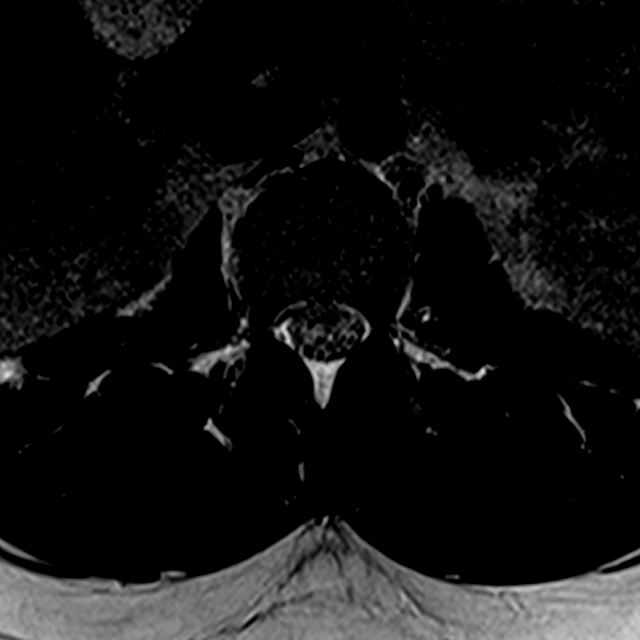

[Series 7: T1 · axial · 4.0mm · 0.24mm/px · z∈[-81,+51]mm · 3 of 40 slices shown (2 of 2)]
[im 6/40]
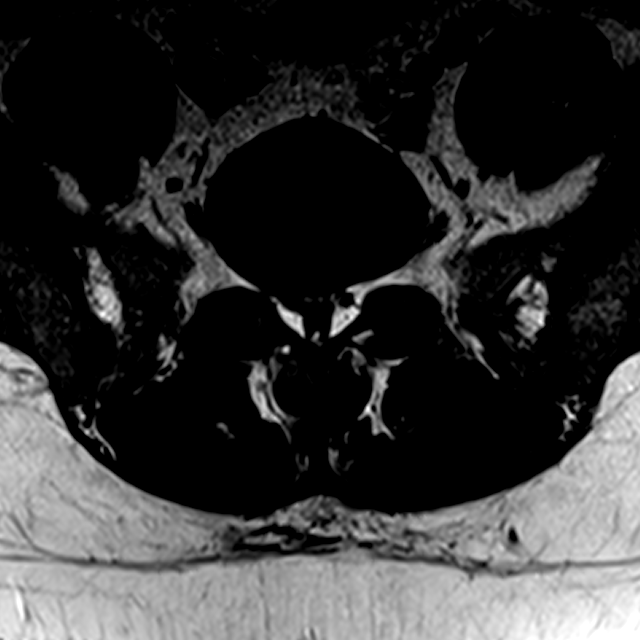
[im 20/40]
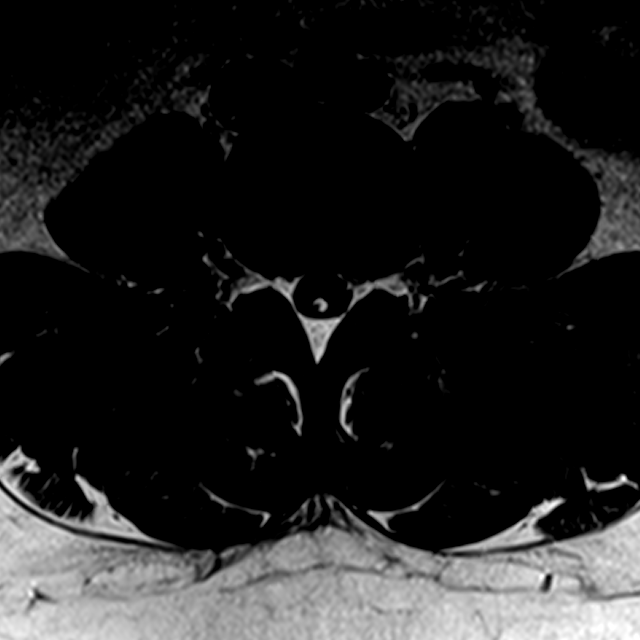
[im 34/40]
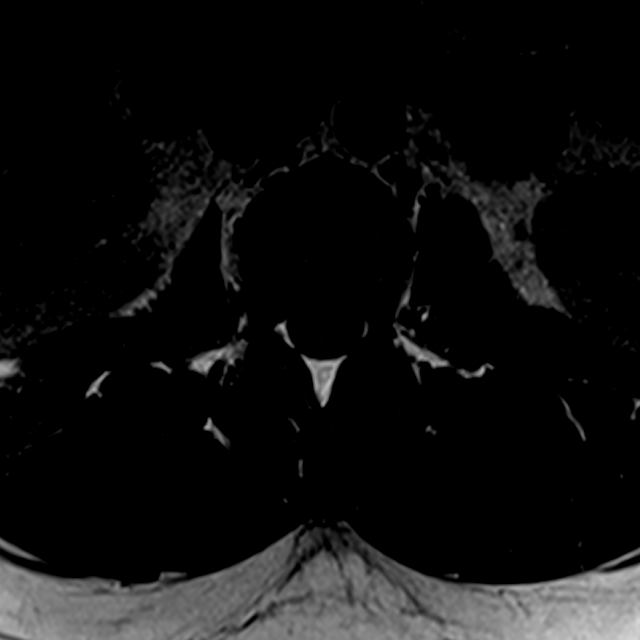

[14 of 48 positions shown; findings below may reference images not displayed]

FINDINGS: Segmentation: Standard. Lowest well-formed disc labeled the L5-S1
level.

Alignment: Mild straightening of the normal lumbar lordosis. No
listhesis or subluxation.

Vertebrae: Vertebral body height maintained without evidence for
acute or chronic fracture. Bone marrow signal intensity diffusely
decreased on T1 weighted imaging, most commonly related to anemia,
smoking, or obesity. No discrete or worrisome osseous lesions. No
abnormal marrow edema.

Conus medullaris and cauda equina: Conus extends to the L1 level.
Conus a jewelry is within normal limits. Incidental note made of a
fatty filum.

Paraspinal and other soft tissues: Paraspinous soft tissues within
normal limits. Visualized visceral structures unremarkable.

Disc levels:

L1-2:  Unremarkable.

L2-3: Negative interspace. Moderate right with mild left facet
hypertrophy. No canal or foraminal stenosis. No impingement.

L3-4: Negative interspace. Mild to moderate facet hypertrophy,
slightly worse on the right. Mild epidural lipomatosis. No canal or
foraminal stenosis.

L4-5: Mild diffuse disc bulge with disc desiccation and
intervertebral disc space narrowing. Superimposed right foraminal
disc protrusion impinges upon the right L4 nerve root in the right
neural foramen (series 4, image 4). Additional smaller left
foraminal disc protrusion contacts the exiting left L4 nerve root in
the left neural foramen as well (series 4, image 12). Moderate
bilateral facet hypertrophy. Epidural lipomatosis. Resultant mild
spinal stenosis. Moderate right with mild left L4 foraminal
narrowing.

L5-S1: Disc desiccation. Broad based right subarticular disc
protrusion extends into the right lateral recess, contacting and
mildly displacing the descending right S1 nerve root (series 7,
image 35). Epidural lipomatosis. Mild facet degeneration. No
significant spinal stenosis. Mild right L5 foraminal narrowing.
IMPRESSION: 1. Right foraminal disc protrusion at L4-5, impinging upon the
exiting right L4 nerve root in the right neural foramen.
2. Broad-based right subarticular disc protrusion at L5-S1,
contacting and mildly displacing the descending right S1 nerve root.
3. Additional small left foraminal disc protrusion at L4-5,
contacting the exiting left L4 nerve root.
4. Mild-to-moderate multilevel facet hypertrophy at L2-3 through
L5-S1.

## 2020-07-03 ENCOUNTER — Ambulatory Visit: Payer: Medicaid Other | Admitting: Orthopaedic Surgery

## 2020-07-26 ENCOUNTER — Other Ambulatory Visit: Payer: Self-pay

## 2020-07-26 ENCOUNTER — Ambulatory Visit: Payer: Self-pay

## 2020-07-26 ENCOUNTER — Encounter: Payer: Self-pay | Admitting: Orthopaedic Surgery

## 2020-07-26 ENCOUNTER — Ambulatory Visit (INDEPENDENT_AMBULATORY_CARE_PROVIDER_SITE_OTHER): Payer: Medicaid Other | Admitting: Orthopaedic Surgery

## 2020-07-26 DIAGNOSIS — G8929 Other chronic pain: Secondary | ICD-10-CM | POA: Diagnosis not present

## 2020-07-26 DIAGNOSIS — M5459 Other low back pain: Secondary | ICD-10-CM | POA: Diagnosis not present

## 2020-07-26 DIAGNOSIS — M545 Low back pain, unspecified: Secondary | ICD-10-CM

## 2020-07-26 NOTE — Progress Notes (Signed)
Office Visit Note   Patient: Donna Moore           Date of Birth: 03/31/1980           MRN: [id number] Visit Date: 07/26/2020              Requested by: Toma Deiters, MD [address] [place],  [place] [address] PCP: Toma Deiters, MD   Assessment & Plan: Visit Diagnoses:  1. Low back pain, unspecified back pain laterality, unspecified chronicity, unspecified whether sciatica present   2. Chronic bilateral low back pain without sciatica     Plan Donna Moore has had pain off-and-on in her low back for approximately 8 years originating with the birth of her son in 2014.  Her x-rays demonstrate degenerative disc disease at L5-S1.  There is no radiculopathy or neurologic deficit.  We will try a course of physical therapy.  We discussed use of over-the-counter medicine.  If she has an exacerbation she might be a candidate for oral prednisone.  At some point we may want to consider an MRI scan.  We will try the physical therapy in [place] where she lives and have her return in the next 4 to 6 weeks if no improvement  Follow-Up Instructions: No follow-ups on file.   Orders:  Orders Placed This Encounter  Procedures   XR Lumbar Spine 2-3 Views   Ambulatory referral to Physical Therapy   No orders of the defined types were placed in this encounter.     Procedures: No procedures performed   Clinical Data: No additional findings.   Subjective: Chief Complaint  Patient presents with   Lower Back - Pain    States that she has had back pain since she had her son, but this pain is different than her normal back pain  Onset of low back pain about 8 years ago after the birth of her son.  She has had recurrent episodes of low back pain without referred discomfort or either buttock or either lower extremity.  She denies any bowel or bladder changes.  The pain can be sharp and quite severe to the point where she has to lie down in bed.  Denies any history of injury or trauma.   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  Physical Exam Constitutional:      Appearance: She is well-developed.  Eyes:     Pupils: Pupils are equal, round, and reactive to light.  Pulmonary:     Effort: Pulmonary effort is normal.  Skin:    General: Skin is warm and dry.  Neurological:     Mental Status: She is alert and oriented to person, place, and time.  Psychiatric:        Behavior: Behavior normal.    Ortho Exam awake alert and oriented x3.  Comfortable sitting.  Straight leg raise negative.  Motor and sensory exam intact.  Reflexes symmetrical to both lower extremities.  There was some mild percussible tenderness at the lumbosacral junction.  Pelvis appears to be level.  Able to forward bend and just about touch the tips of her fingers to the tips of her toes.  Back extension without pain  Specialty Comments:  No specialty comments available.  Imaging: XR Lumbar Spine 2-3 Views  Result Date: 07/26/2020 Films of the lumbar spine were obtained in 2 projections.  No evidence of a listhesis  or scoliosis but there is decreased disc space between L5 and S1 with facet arthropathy.  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    PMFS History: Patient Active Problem List   Diagnosis Date Noted   Low back pain 07/26/2020   Chondromalacia of right knee    Condyloma acuminatum in female 09/06/2015   Vitamin D deficiency 08/27/2015   Menstrual cramps 08/22/2015   Patient desires pregnancy 08/22/2015   Accessory skin tags 08/22/2015   Encounter for gynecological examination 11/22/2013   Constipation 01/28/2013   Benign essential hypertension with delivery 01/26/2013   Incisional infection 01/26/2013   Status post primary low transverse cesarean section 01/21/2013   Hypertension complicating pregnancy 01/19/2013   HTN in pregnancy, chronic 01/18/2013   Benign essential  hypertension antepartum 01/10/2013   Abnormal maternal glucose tolerance, antepartum 12/27/2012   Acute constipation 09/27/2012   Past Medical History:  Diagnosis Date   Accessory skin tags 08/22/2015   Benign essential hypertension with delivery 01/26/2013   Constipation 01/28/2013   Gestational diabetes    gestational   Heart murmur    Incisional infection 01/26/2013   Menstrual cramps 08/22/2015   Patient desires pregnancy 08/22/2015   Pregnancy 06/21/2012   Pregnancy induced hypertension     Family History  Problem Relation Age of Onset   Hypertension Mother    Diabetes Mother    Hypertension Father    Diabetes Brother     Past Surgical History:  Procedure Laterality Date   CESAREAN SECTION N/A 01/19/2013   Procedure: CESAREAN SECTION;  Surgeon: Tilda Burrow, MD;  Location: [facility];  Service: Obstetrics;  Laterality: N/A;   cyst drained     Emergency surg, and in hospital for 1 week.;pilonidal   KNEE ARTHROSCOPY Right 06/18/2016   Procedure: ARTHROSCOPY KNEE + CHONDROPLASTY on right patella; abrasion arthroplasty on right femur;  Surgeon: Vickki Hearing, MD;  Location: [facility];  Service: Orthopedics;  Laterality: Right;   Social History   Occupational History   Not on file  Tobacco Use   Smoking status: Former    Pack years: 0.00    Types: Cigars    Quit date: 01/11/2012    Years since quitting: 8.5   Smokeless tobacco: Never   Tobacco comments:    2-3 times week  Vaping Use   Vaping Use: Never used  Substance and Sexual Activity   Alcohol use: No   Drug use: No   Sexual activity: Yes    Birth control/protection: None     Valeria Batman, MD   Note - This record has been created using [employer].  Chart creation errors have been sought, but may not always  have been located. Such creation errors do not reflect on  the standard of medical care.

## 2020-07-30 ENCOUNTER — Telehealth: Payer: Self-pay | Admitting: Orthopaedic Surgery

## 2020-07-30 ENCOUNTER — Other Ambulatory Visit: Payer: Self-pay

## 2020-07-30 DIAGNOSIS — M545 Low back pain, unspecified: Secondary | ICD-10-CM

## 2020-07-30 NOTE — Telephone Encounter (Signed)
Pt called stating she saw DR. Whitfield for back pain, and over the weekend her pain got worse. Pt states Dr. Cleophas Dunker mentioned something about possibly trying a steroid injection; so she is wanting a CB to discuss this further and any other options she may have.   [phone]

## 2020-07-30 NOTE — Telephone Encounter (Signed)
Please refer to Dr Alvester Morin for injection-he will determine which one to perform-thanks

## 2020-07-30 NOTE — Telephone Encounter (Signed)
Spoke with patient and referral entered for a visit with Dr.Newton to get injection that he feels may be appropriate.

## 2020-07-31 ENCOUNTER — Telehealth: Payer: Self-pay

## 2020-07-31 DIAGNOSIS — F411 Generalized anxiety disorder: Secondary | ICD-10-CM

## 2020-07-31 NOTE — Telephone Encounter (Signed)
Pt has req Rx for her appt on 6/27

## 2020-08-06 MED ORDER — DIAZEPAM 5 MG PO TABS: ORAL_TABLET | ORAL | 0 refills | Status: DC

## 2020-08-06 NOTE — Addendum Note (Signed)
Addended by: Ashok Norris on: 08/06/2020 01:34 PM   Modules accepted: Orders

## 2020-08-13 ENCOUNTER — Other Ambulatory Visit: Payer: Self-pay

## 2020-08-13 ENCOUNTER — Encounter: Payer: Self-pay | Admitting: Physical Medicine and Rehabilitation

## 2020-08-13 ENCOUNTER — Ambulatory Visit: Payer: Medicaid Other | Admitting: Physical Medicine and Rehabilitation

## 2020-08-13 ENCOUNTER — Ambulatory Visit: Payer: Self-pay

## 2020-08-13 VITALS — BP 162/106 | HR 97

## 2020-08-13 DIAGNOSIS — M5416 Radiculopathy, lumbar region: Secondary | ICD-10-CM

## 2020-08-13 MED ORDER — BETAMETHASONE SOD PHOS & ACET 6 (3-3) MG/ML IJ SUSP
12.0000 mg | Freq: Once | INTRAMUSCULAR | Status: AC
  Administered 2020-08-13: 12 mg

## 2020-08-13 NOTE — Procedures (Signed)
Lumbar Epidural Steroid Injection - Interlaminar Approach with Fluoroscopic Guidance  Patient: Donna Moore      Date of Birth: June 02, 1980 MRN: [id number] PCP: Toma Deiters, MD      Visit Date: 08/13/2020   Universal Protocol:     Consent Given By: the patient  Position: PRONE  Additional Comments: Vital signs were monitored before and after the procedure. Patient was prepped and draped in the usual sterile fashion. The correct patient, procedure, and site was verified.   Injection Procedure Details:   Procedure diagnoses: Lumbar radiculopathy [M54.16]   Meds Administered:  Meds ordered this encounter  Medications   betamethasone acetate-betamethasone sodium phosphate (CELESTONE) injection 12 mg     Laterality: Right  Location/Site:  L5-S1  Needle: 3.5 in., 20 ga. Tuohy  Needle Placement: Paramedian epidural  Findings:   -Comments: Excellent flow of contrast into the epidural space.  Procedure Details: Using a paramedian approach from the side mentioned above, the region overlying the inferior lamina was localized under fluoroscopic visualization and the soft tissues overlying this structure were infiltrated with 4 ml. of 1% Lidocaine without Epinephrine. The Tuohy needle was inserted into the epidural space using a paramedian approach.   The epidural space was localized using loss of resistance along with counter oblique bi-planar fluoroscopic views.  After negative aspirate for air, blood, and CSF, a 2 ml. volume of Isovue-250 was injected into the epidural space and the flow of contrast was observed. Radiographs were obtained for documentation purposes.    The injectate was administered into the level noted above.   Additional Comments:  The patient tolerated the procedure well Dressing: 2 x 2 sterile gauze and Band-Aid    Post-procedure details: Patient was observed during the procedure. Post-procedure instructions were reviewed.  Patient left the  clinic in stable condition.

## 2020-08-13 NOTE — Progress Notes (Signed)
Donna Moore - 40 y.o. female MRN [id number]  Date of birth: 1980/08/10  Office Visit Note: Visit Date: 08/13/2020 PCP: Toma Deiters, MD Referred by: Toma Deiters, MD  Subjective: Chief Complaint  Patient presents with   Lower Back - Pain   HPI:  Donna Moore is a 40 y.o. female who comes in today at the request of Dr. Norlene Campbell for planned Right L5-S1 Lumbar Interlaminar epidural steroid injection with fluoroscopic guidance.  The patient has failed conservative care including home exercise, medications, time and activity modification.  This injection will be diagnostic and hopefully therapeutic.  Please see requesting physician notes for further details and justification. MRI reviewed with images and spine model.  MRI reviewed in the note below.  Patient does have mostly axial back pain but occasional referral in the right hip and thigh to the knee.  She is having some knee pain as well.  She has pain referring up the lumbar spine upwards which is probably myofascial.  She does have multilevel facet arthropathy on MRI from 2020.  She is very nervous today about the injection but did well.  She had an order placed by Dr. Cleophas Dunker for [facility] physical therapy.  She says that no one has contacted her about that.  I have asked her to talk to Dr. Hoy Register staff about getting set up.  I think she would benefit from physical therapy and core strengthening of her lumbar spine.     ROS Otherwise per HPI.  Assessment & Plan: Visit Diagnoses:    ICD-10-CM   1. Lumbar radiculopathy  M54.16 XR C-ARM NO REPORT    Epidural Steroid injection    betamethasone acetate-betamethasone sodium phosphate (CELESTONE) injection 12 mg      Plan: No additional findings.   Meds & Orders:  Meds ordered this encounter  Medications   betamethasone acetate-betamethasone sodium phosphate (CELESTONE) injection 12 mg    Orders Placed This Encounter  Procedures   XR C-ARM NO REPORT    Epidural Steroid injection    Follow-up: Return if symptoms worsen or fail to improve.   Procedures: No procedures performed  Lumbar Epidural Steroid Injection - Interlaminar Approach with Fluoroscopic Guidance  Patient: Donna Moore      Date of Birth: 19-Mar-1980 MRN: [id number] PCP: Toma Deiters, MD      Visit Date: 08/13/2020   Universal Protocol:     Consent Given By: the patient  Position: PRONE  Additional Comments: Vital signs were monitored before and after the procedure. Patient was prepped and draped in the usual sterile fashion. The correct patient, procedure, and site was verified.   Injection Procedure Details:   Procedure diagnoses: Lumbar radiculopathy [M54.16]   Meds Administered:  Meds ordered this encounter  Medications   betamethasone acetate-betamethasone sodium phosphate (CELESTONE) injection 12 mg     Laterality: Right  Location/Site:  L5-S1  Needle: 3.5 in., 20 ga. Tuohy  Needle Placement: Paramedian epidural  Findings:   -Comments: Excellent flow of contrast into the epidural space.  Procedure Details: Using a paramedian approach from the side mentioned above, the region overlying the inferior lamina was localized under fluoroscopic visualization and the soft tissues overlying this structure were infiltrated with 4 ml. of 1% Lidocaine without Epinephrine. The Tuohy needle was inserted into the epidural space using a paramedian approach.   The epidural space was localized using loss of resistance along with counter oblique bi-planar fluoroscopic views.  After negative aspirate for air, blood, and CSF,  a 2 ml. volume of Isovue-250 was injected into the epidural space and the flow of contrast was observed. Radiographs were obtained for documentation purposes.    The injectate was administered into the level noted above.   Additional Comments:  The patient tolerated the procedure well Dressing: 2 x 2 sterile gauze and Band-Aid     Post-procedure details: Patient was observed during the procedure. Post-procedure instructions were reviewed.  Patient left the clinic in stable condition.    Clinical History: MRI LUMBAR SPINE WITHOUT CONTRAST   TECHNIQUE: Multiplanar, multisequence MR imaging of the lumbar spine was performed. No intravenous contrast was administered.   COMPARISON:  Prior radiographs from 04/21/2018.   FINDINGS: Segmentation: Standard. Lowest well-formed disc labeled the L5-S1 level.   Alignment: Mild straightening of the normal lumbar lordosis. No listhesis or subluxation.   Vertebrae: Vertebral body height maintained without evidence for acute or chronic fracture. Bone marrow signal intensity diffusely decreased on T1 weighted imaging, most commonly related to anemia, smoking, or obesity. No discrete or worrisome osseous lesions. No abnormal marrow edema.   Conus medullaris and cauda equina: Conus extends to the L1 level. Conus a jewelry is within normal limits. Incidental note made of a fatty filum.   Paraspinal and other soft tissues: Paraspinous soft tissues within normal limits. Visualized visceral structures unremarkable.   Disc levels:   L1-2:  Unremarkable.   L2-3: Negative interspace. Moderate right with mild left facet hypertrophy. No canal or foraminal stenosis. No impingement.   L3-4: Negative interspace. Mild to moderate facet hypertrophy, slightly worse on the right. Mild epidural lipomatosis. No canal or foraminal stenosis.   L4-5: Mild diffuse disc bulge with disc desiccation and intervertebral disc space narrowing. Superimposed right foraminal disc protrusion impinges upon the right L4 nerve root in the right neural foramen (series 4, image 4). Additional smaller left foraminal disc protrusion contacts the exiting left L4 nerve root in the left neural foramen as well (series 4, image 12). Moderate bilateral facet hypertrophy. Epidural lipomatosis. Resultant  mild spinal stenosis. Moderate right with mild left L4 foraminal narrowing.   L5-S1: Disc desiccation. Broad based right subarticular disc protrusion extends into the right lateral recess, contacting and mildly displacing the descending right S1 nerve root (series 7, image 35). Epidural lipomatosis. Mild facet degeneration. No significant spinal stenosis. Mild right L5 foraminal narrowing.   IMPRESSION: 1. Right foraminal disc protrusion at L4-5, impinging upon the exiting right L4 nerve root in the right neural foramen. 2. Broad-based right subarticular disc protrusion at L5-S1, contacting and mildly displacing the descending right S1 nerve root. 3. Additional small left foraminal disc protrusion at L4-5, contacting the exiting left L4 nerve root. 4. Mild-to-moderate multilevel facet hypertrophy at L2-3 through L5-S1.     Electronically Signed   By: Rise Mu M.D.   On: 08/27/2018 04:46     Objective:  VS:  HT:    WT:   BMI:     BP:(!) 162/106  HR:97bpm  TEMP: ( )  RESP:  Physical Exam Vitals and nursing note reviewed.  Constitutional:      General: She is not in acute distress.    Appearance: Normal appearance. She is not ill-appearing.  HENT:     Head: Normocephalic and atraumatic.     Right Ear: External ear normal.     Left Ear: External ear normal.  Eyes:     Extraocular Movements: Extraocular movements intact.  Cardiovascular:     Rate and Rhythm: Normal rate.  Pulses: Normal pulses.  Pulmonary:     Effort: Pulmonary effort is normal. No respiratory distress.  Abdominal:     General: There is no distension.     Palpations: Abdomen is soft.  Musculoskeletal:        General: Tenderness present.     Cervical back: Neck supple.     Right lower leg: No edema.     Left lower leg: No edema.     Comments: Patient has good distal strength with no pain over the greater trochanters.  No clonus or focal weakness.  Skin:    Findings: No erythema,  lesion or rash.  Neurological:     General: No focal deficit present.     Mental Status: She is alert and oriented to person, place, and time.     Sensory: No sensory deficit.     Motor: No weakness or abnormal muscle tone.     Coordination: Coordination normal.  Psychiatric:        Mood and Affect: Mood normal.        Behavior: Behavior normal.     Imaging: No results found.

## 2020-08-13 NOTE — Progress Notes (Signed)
Pt state lower back pain. Pt state any movement makes the pain worse. Pt state she takes over the counter pain meds to help ease her pain.  Numeric Pain Rating Scale and Functional Assessment Average Pain 8   In the last MONTH (on 0-10 scale) has pain interfered with the following?  1. General activity like being  able to carry out your everyday physical activities such as walking, climbing stairs, carrying groceries, or moving a chair?  Rating(10)   +Driver, -BT, -Dye Allergies.

## 2020-08-13 NOTE — Patient Instructions (Signed)

## 2020-08-21 ENCOUNTER — Telehealth: Payer: Self-pay | Admitting: Physical Medicine and Rehabilitation

## 2020-08-21 NOTE — Telephone Encounter (Signed)
PT states that the epidural injection did not work. She wants someone to call to see what you recommend. She said it feels like shooting flames in her back.  CB [phone]

## 2020-08-22 ENCOUNTER — Telehealth: Payer: Self-pay | Admitting: Physical Medicine and Rehabilitation

## 2020-08-22 NOTE — Telephone Encounter (Signed)
Patient called. Says she is still in pain. Would like Dr. Alvester Morin to know. Ask what else she should do. Her call back number is [phone]

## 2020-08-22 NOTE — Telephone Encounter (Signed)
Scheduled for Tuesday at 1045

## 2020-08-28 ENCOUNTER — Other Ambulatory Visit: Payer: Self-pay

## 2020-08-28 ENCOUNTER — Encounter: Payer: Self-pay | Admitting: Physical Medicine and Rehabilitation

## 2020-08-28 ENCOUNTER — Ambulatory Visit: Payer: Medicaid Other | Admitting: Physical Medicine and Rehabilitation

## 2020-08-28 VITALS — BP 149/91 | HR 94

## 2020-08-28 DIAGNOSIS — M5116 Intervertebral disc disorders with radiculopathy, lumbar region: Secondary | ICD-10-CM | POA: Diagnosis not present

## 2020-08-28 DIAGNOSIS — M5416 Radiculopathy, lumbar region: Secondary | ICD-10-CM | POA: Diagnosis not present

## 2020-08-28 DIAGNOSIS — M5442 Lumbago with sciatica, left side: Secondary | ICD-10-CM

## 2020-08-28 DIAGNOSIS — G8929 Other chronic pain: Secondary | ICD-10-CM | POA: Diagnosis not present

## 2020-08-28 MED ORDER — GABAPENTIN 600 MG PO TABS: ORAL_TABLET | ORAL | 0 refills | Status: DC

## 2020-08-28 NOTE — Progress Notes (Signed)
Right L5-S1 IL on 08/13/20. No relief at all from injection. States that it feels like the pain got worse. She also states that she had some side effects from steroid- headaches.  Low back pain which she states sometimes goes up and down the back with numbness in the left side. Numeric Pain Rating Scale and Functional Assessment Average Pain 10   In the last MONTH (on 0-10 scale) has pain interfered with the following?  1. General activity like being  able to carry out your everyday physical activities such as walking, climbing stairs, carrying groceries, or moving a chair?  Rating(10)

## 2020-08-28 NOTE — Progress Notes (Signed)
Donna Moore - 40 y.o. female MRN [id number]  Date of birth: 04-Sep-1980  Office Visit Note: Visit Date: 08/28/2020 PCP: Toma Deiters, MD Referred by: Toma Deiters, MD  Subjective: Chief Complaint  Patient presents with   Lower Back - Pain   HPI: Donna Moore is a 40 y.o. female who comes in today for evaluation of chronic, worsening, left lower back pain radiating down posterior leg to calf.  Patient reports pain has been ongoing for several months and describes as a shooting sensation.  Patient reports pain is 10 out of 10 at present.  Patient reports pain increases with activity and prolonged standing.  Patient reports rest seems to help relieve pain, patient continues to take Ibuprofen and Tylenol at home with some pain relief.  Patient reports increasing back pain is making it difficult to care for her disabled son.  Patient recently had right L5-S1 interlaminar epidural steroid injection in June with no relief.  Patient's recent lumbar MRI exhibits small left foraminal disc protrusion at L4 and L5.  There is also some level of epidural lipomatosis.  Mild to moderate multilevel facet hypertrophy is also noted.  Patient reports that she saw Dr. Norlene Campbell previously for same issue and would like to inquire about the referral for physical therapy in [place] that he suggested. Patient verbalizes that she would like to try physical therapy before performing another epidural steroid injection.  Patient denies focal weakness, numbness, and tingling.  Patient denies recent falls or trauma.  Review of Systems  Musculoskeletal:  Positive for back pain and joint pain.  Neurological:  Negative for tingling and focal weakness.  All other systems reviewed and are negative. Otherwise per HPI.  Assessment & Plan: Visit Diagnoses:    ICD-10-CM   1. Lumbar radiculopathy  M54.16     2. Intervertebral disc disorders with radiculopathy, lumbar region  M51.16     3. Chronic left-sided  low back pain with left-sided sciatica  M54.42    G89.29        Plan: Findings:  Chronic, worsening, and severe lumbar radiculopathy, left greater than right.  Imaging and exam are consistent with L5 and S1 nerve patterns.  No relief from previous L5-S1 interlaminar ESI.  Patient continues to have severe pain that is making it difficult to care for her family.  Spoke with patient in detail about plan of care today in the office.  Patient would like to try physical therapy.   Handwritten prescription for physical therapy given to patient for [facility] in [place].  I had an in-depth discussion with patient regarding pain management at home and she is agreeable to starting Gabapentin.  Patient will also continue Ibuprofen and Tylenol at home as needed.  Depending on relief would look at transforaminal epidural injection instead of the interlaminar approach as that may be more beneficial for her.  Patient to follow-up in the office approximately 6 weeks from now for re-evaluation.    Meds & Orders:  Meds ordered this encounter  Medications   gabapentin (NEURONTIN) 600 MG tablet    Sig: Take 0.5mg  tablet once at night for 1 week. Then 0.5mg  tablet in the morning and 0.5mg  tablet at night for 1 week. Then 0.5mg  tablet morning, noon and night for a total of 300mg  TID.    Dispense:  90 tablet    Refill:  0    Order Specific Question:   Supervising Provider    Answer:   ,  FREDERIC [[id number]]   No orders of the defined types were placed in this encounter.   Follow-up: Return in about 6 weeks (around 10/09/2020).   Procedures: No procedures performed      Clinical History: MRI LUMBAR SPINE WITHOUT CONTRAST   TECHNIQUE: Multiplanar, multisequence MR imaging of the lumbar spine was performed. No intravenous contrast was administered.   COMPARISON:  Prior radiographs from 04/21/2018.   FINDINGS: Segmentation: Standard. Lowest well-formed disc labeled the L5-S1 level.    Alignment: Mild straightening of the normal lumbar lordosis. No listhesis or subluxation.   Vertebrae: Vertebral body height maintained without evidence for acute or chronic fracture. Bone marrow signal intensity diffusely decreased on T1 weighted imaging, most commonly related to anemia, smoking, or obesity. No discrete or worrisome osseous lesions. No abnormal marrow edema.   Conus medullaris and cauda equina: Conus extends to the L1 level. Conus a jewelry is within normal limits. Incidental note made of a fatty filum.   Paraspinal and other soft tissues: Paraspinous soft tissues within normal limits. Visualized visceral structures unremarkable.   Disc levels:   L1-2:  Unremarkable.   L2-3: Negative interspace. Moderate right with mild left facet hypertrophy. No canal or foraminal stenosis. No impingement.   L3-4: Negative interspace. Mild to moderate facet hypertrophy, slightly worse on the right. Mild epidural lipomatosis. No canal or foraminal stenosis.   L4-5: Mild diffuse disc bulge with disc desiccation and intervertebral disc space narrowing. Superimposed right foraminal disc protrusion impinges upon the right L4 nerve root in the right neural foramen (series 4, image 4). Additional smaller left foraminal disc protrusion contacts the exiting left L4 nerve root in the left neural foramen as well (series 4, image 12). Moderate bilateral facet hypertrophy. Epidural lipomatosis. Resultant mild spinal stenosis. Moderate right with mild left L4 foraminal narrowing.   L5-S1: Disc desiccation. Broad based right subarticular disc protrusion extends into the right lateral recess, contacting and mildly displacing the descending right S1 nerve root (series 7, image 35). Epidural lipomatosis. Mild facet degeneration. No significant spinal stenosis. Mild right L5 foraminal narrowing.   IMPRESSION: 1. Right foraminal disc protrusion at L4-5, impinging upon the exiting right  L4 nerve root in the right neural foramen. 2. Broad-based right subarticular disc protrusion at L5-S1, contacting and mildly displacing the descending right S1 nerve root. 3. Additional small left foraminal disc protrusion at L4-5, contacting the exiting left L4 nerve root. 4. Mild-to-moderate multilevel facet hypertrophy at L2-3 through L5-S1.     Electronically Signed   By: Rise Mu M.D.   On: 08/27/2018 04:46   She reports that she quit smoking about 8 years ago. Her smoking use included cigars. She has never used smokeless tobacco. No results for input(s): HGBA1C, LABURIC in the last 8760 hours.  Objective:  VS:  HT:    WT:   BMI:     BP:(!) 149/91  HR:94bpm  TEMP: ( )  RESP:  Physical Exam Constitutional:      Appearance: She is obese.  HENT:     Head: Normocephalic and atraumatic.     Right Ear: Tympanic membrane normal.     Left Ear: Tympanic membrane normal.     Nose: Nose normal.     Mouth/Throat:     Mouth: Mucous membranes are moist.  Eyes:     Pupils: Pupils are equal, round, and reactive to light.  Cardiovascular:     Rate and Rhythm: Normal rate.  Pulmonary:     Effort: Pulmonary effort is  normal.  Abdominal:     General: There is no distension.  Musculoskeletal:     Cervical back: Normal range of motion and neck supple.     Comments: pt rises from seated position to standing without difficulty. Good lumbar range of motion. Strong distal strength without clonus, no pain upon palpation of greater trochanters. Sensation intact bilaterally. Walks independently, gait steady. Negative slump test.      Skin:    General: Skin is warm and dry.     Capillary Refill: Capillary refill takes less than 2 seconds.  Neurological:     General: No focal deficit present.     Mental Status: She is alert and oriented to person, place, and time.  Psychiatric:        Mood and Affect: Mood normal.    Ortho Exam  Imaging: No results found.  Past  Medical/Family/Surgical/Social History: Medications & Allergies reviewed per EMR, new medications updated. Patient Active Problem List   Diagnosis Date Noted   Low back pain 07/26/2020   Chondromalacia of right knee    Condyloma acuminatum in female 09/06/2015   Vitamin D deficiency 08/27/2015   Menstrual cramps 08/22/2015   Patient desires pregnancy 08/22/2015   Accessory skin tags 08/22/2015   Encounter for gynecological examination 11/22/2013   Constipation 01/28/2013   Benign essential hypertension with delivery 01/26/2013   Incisional infection 01/26/2013   Status post primary low transverse cesarean section 01/21/2013   Hypertension complicating pregnancy 01/19/2013   HTN in pregnancy, chronic 01/18/2013   Benign essential hypertension antepartum 01/10/2013   Abnormal maternal glucose tolerance, antepartum 12/27/2012   Acute constipation 09/27/2012   Past Medical History:  Diagnosis Date   Accessory skin tags 08/22/2015   Benign essential hypertension with delivery 01/26/2013   Constipation 01/28/2013   Gestational diabetes    gestational   Heart murmur    Incisional infection 01/26/2013   Menstrual cramps 08/22/2015   Patient desires pregnancy 08/22/2015   Pregnancy 06/21/2012   Pregnancy induced hypertension    Family History  Problem Relation Age of Onset   Hypertension Mother    Diabetes Mother    Hypertension Father    Diabetes Brother    Past Surgical History:  Procedure Laterality Date   CESAREAN SECTION N/A 01/19/2013   Procedure: CESAREAN SECTION;  Surgeon: Tilda Burrow, MD;  Location: [facility];  Service: Obstetrics;  Laterality: N/A;   cyst drained     Emergency surg, and in hospital for 1 week.;pilonidal   KNEE ARTHROSCOPY Right 06/18/2016   Procedure: ARTHROSCOPY KNEE + CHONDROPLASTY on right patella; abrasion arthroplasty on right femur;  Surgeon: Vickki Hearing, MD;  Location: [facility];  Service: Orthopedics;  Laterality: Right;   Social History    Occupational History   Not on file  Tobacco Use   Smoking status: Former    Pack years: 0.00    Types: Cigars    Quit date: 01/11/2012    Years since quitting: 8.6   Smokeless tobacco: Never   Tobacco comments:    2-3 times week  Vaping Use   Vaping Use: Never used  Substance and Sexual Activity   Alcohol use: No   Drug use: No   Sexual activity: Yes    Birth control/protection: None

## 2023-03-17 ENCOUNTER — Telehealth: Payer: Medicaid Other | Admitting: Family Medicine

## 2023-03-17 DIAGNOSIS — M5441 Lumbago with sciatica, right side: Secondary | ICD-10-CM

## 2023-03-17 NOTE — Progress Notes (Signed)
  Because you are having weakness and numbness and high level of pain your condition warrants further evaluation and I recommend that you be seen in a face-to-face visit at your local PCP office or urgent care.   NOTE: There will be NO CHARGE for this E-Visit   If you are having a true medical emergency, please call 911.

## 2023-04-08 ENCOUNTER — Telehealth: Payer: Medicaid Other | Admitting: Physician Assistant

## 2023-04-08 DIAGNOSIS — Z20828 Contact with and (suspected) exposure to other viral communicable diseases: Secondary | ICD-10-CM

## 2023-04-08 DIAGNOSIS — R6889 Other general symptoms and signs: Secondary | ICD-10-CM

## 2023-04-08 MED ORDER — OSELTAMIVIR PHOSPHATE 75 MG PO CAPS: 75.0000 mg | ORAL_CAPSULE | Freq: Two times a day (BID) | ORAL | 0 refills | Status: DC

## 2023-04-08 NOTE — Progress Notes (Signed)
 Message sent to patient requesting further input regarding current symptoms. Awaiting patient response.

## 2023-04-08 NOTE — Progress Notes (Signed)
 I have spent 5 minutes in review of e-visit questionnaire, review and updating patient chart, medical decision making and response to patient.   Piedad Climes, PA-C

## 2023-04-08 NOTE — Progress Notes (Signed)

## 2023-05-15 ENCOUNTER — Telehealth: Admitting: Physician Assistant

## 2023-05-15 DIAGNOSIS — K0889 Other specified disorders of teeth and supporting structures: Secondary | ICD-10-CM | POA: Diagnosis not present

## 2023-05-15 MED ORDER — MELOXICAM 7.5 MG PO TABS: 7.5000 mg | ORAL_TABLET | Freq: Every day | ORAL | 0 refills | Status: AC

## 2023-05-15 MED ORDER — AMOXICILLIN-POT CLAVULANATE 875-125 MG PO TABS: 1.0000 | ORAL_TABLET | Freq: Two times a day (BID) | ORAL | 0 refills | Status: AC

## 2023-05-15 NOTE — Progress Notes (Signed)
 E-Visit for Dental Pain  We are sorry that you are not feeling well.  Here is how we plan to help!  Based on what you have shared with me in the questionnaire, it sounds like you have in infection related to your tooth. I have prescribed an antibiotic to help with your symptoms.  Augmentin 875-125mg  twice a day for 7 days. I have also prescribed a medication to help with your tooth pain called meloxicam.  It is imperative that you see a dentist within 10 days of this eVisit to determine the cause of the dental pain and be sure it is adequately treated  A toothache or tooth pain is caused when the nerve in the root of a tooth or surrounding a tooth is irritated. Dental (tooth) infection, decay, injury, or loss of a tooth are the most common causes of dental pain. Pain may also occur after an extraction (tooth is pulled out). Pain sometimes originates from other areas and radiates to the jaw, thus appearing to be tooth pain.Bacteria growing inside your mouth can contribute to gum disease and dental decay, both of which can cause pain. A toothache occurs from inflammation of the central portion of the tooth called pulp. The pulp contains nerve endings that are very sensitive to pain. Inflammation to the pulp or pulpitis may be caused by dental cavities, trauma, and infection.    HOME CARE:   For toothaches: Over-the-counter pain medications such as acetaminophen or ibuprofen may be used. Take these as directed on the package while you arrange for a dental appointment. Avoid very cold or hot foods, because they may make the pain worse. You may get relief from biting on a cotton ball soaked in oil of cloves. You can get oil of cloves at most drug stores.  For jaw pain:  Aspirin may be helpful for problems in the joint of the jaw in adults. If pain happens every time you open your mouth widely, the temporomandibular joint (TMJ) may be the source of the pain. Yawning or taking a large bite of food may  worsen the pain. An appointment with your doctor or dentist will help you find the cause.     GET HELP RIGHT AWAY IF:  You have a high fever or chills If you have had a recent head or face injury and develop headache, light headedness, nausea, vomiting, or other symptoms that concern you after an injury to your face or mouth, you could have a more serious injury in addition to your dental injury. A facial rash associated with a toothache: This condition may improve with medication. Contact your doctor for them to decide what is appropriate. Any jaw pain occurring with chest pain: Although jaw pain is most commonly caused by dental disease, it is sometimes referred pain from other areas. People with heart disease, especially people who have had stents placed, people with diabetes, or those who have had heart surgery may have jaw pain as a symptom of heart attack or angina. If your jaw or tooth pain is associated with lightheadedness, sweating, or shortness of breath, you should see a doctor as soon as possible. Trouble swallowing or excessive pain or bleeding from gums: If you have a history of a weakened immune system, diabetes, or steroid use, you may be more susceptible to infections. Infections can often be more severe and extensive or caused by unusual organisms. Dental and gum infections in people with these conditions may require more aggressive treatment. An abscess may need  draining or IV antibiotics, for example.  MAKE SURE YOU   Understand these instructions. Will watch your condition. Will get help right away if you are not doing well or get worse.  Thank you for choosing an e-visit.  Your e-visit answers were reviewed by a board certified advanced clinical practitioner to complete your personal care plan. Depending upon the condition, your plan could have included both over the counter or prescription medications.  Please review your pharmacy choice. Make sure the pharmacy is open  so you can pick up prescription now. If there is a problem, you may contact your provider through [employer] and have the prescription routed to another pharmacy.  Your safety is important to [place]. If you have drug allergies check your prescription carefully.   For the next 24 hours you can use MyChart to ask questions about today's visit, request a non-urgent call back, or ask for a work or school excuse. You will get an email in the next two days asking about your experience. I hope that your e-visit has been valuable and will speed your recovery.  Approximately 5 minutes was spent documenting and reviewing patient's chart.

## 2023-06-04 ENCOUNTER — Telehealth: Admitting: Physician Assistant

## 2023-06-04 DIAGNOSIS — M545 Low back pain, unspecified: Secondary | ICD-10-CM

## 2023-06-04 MED ORDER — CYCLOBENZAPRINE HCL 10 MG PO TABS: 10.0000 mg | ORAL_TABLET | Freq: Three times a day (TID) | ORAL | 0 refills | Status: DC | PRN

## 2023-06-04 MED ORDER — NAPROXEN 500 MG PO TABS: 500.0000 mg | ORAL_TABLET | Freq: Two times a day (BID) | ORAL | 0 refills | Status: DC

## 2023-06-04 NOTE — Progress Notes (Signed)

## 2023-06-04 NOTE — Progress Notes (Signed)
 I have spent 5 minutes in review of e-visit questionnaire, review and updating patient chart, medical decision making and response to patient.   Piedad Climes, PA-C

## 2023-07-06 ENCOUNTER — Telehealth: Admitting: Physician Assistant

## 2023-07-06 DIAGNOSIS — K047 Periapical abscess without sinus: Secondary | ICD-10-CM | POA: Diagnosis not present

## 2023-07-06 MED ORDER — NAPROXEN 500 MG PO TABS
500.0000 mg | ORAL_TABLET | Freq: Two times a day (BID) | ORAL | 0 refills | Status: DC
Start: 2023-07-06 — End: 2023-07-21

## 2023-07-06 MED ORDER — AMOXICILLIN-POT CLAVULANATE 875-125 MG PO TABS: 1.0000 | ORAL_TABLET | Freq: Two times a day (BID) | ORAL | 0 refills | Status: DC

## 2023-07-06 NOTE — Progress Notes (Signed)
 I have spent 5 minutes in review of e-visit questionnaire, review and updating patient chart, medical decision making and response to patient.   Piedad Climes, PA-C

## 2023-07-06 NOTE — Progress Notes (Signed)

## 2023-07-21 ENCOUNTER — Telehealth: Admitting: Physician Assistant

## 2023-07-21 DIAGNOSIS — M5441 Lumbago with sciatica, right side: Secondary | ICD-10-CM

## 2023-07-21 DIAGNOSIS — G8929 Other chronic pain: Secondary | ICD-10-CM

## 2023-07-21 DIAGNOSIS — M5442 Lumbago with sciatica, left side: Secondary | ICD-10-CM | POA: Diagnosis not present

## 2023-07-21 MED ORDER — NAPROXEN 500 MG PO TABS
500.0000 mg | ORAL_TABLET | Freq: Two times a day (BID) | ORAL | 0 refills | Status: DC
Start: 2023-07-21 — End: 2023-09-02

## 2023-07-21 MED ORDER — CYCLOBENZAPRINE HCL 10 MG PO TABS: 5.0000 mg | ORAL_TABLET | Freq: Three times a day (TID) | ORAL | 0 refills | Status: DC | PRN

## 2023-07-21 NOTE — Progress Notes (Signed)

## 2023-07-29 ENCOUNTER — Telehealth: Admitting: Physician Assistant

## 2023-07-29 DIAGNOSIS — M5416 Radiculopathy, lumbar region: Secondary | ICD-10-CM

## 2023-07-29 NOTE — Progress Notes (Signed)
  Because of severity of pain and symptoms of nerve irritation/compression, this is above the scope of what we can treat via an e-visit. As such, I feel your condition warrants further evaluation and I recommend that you be seen in a face-to-face visit. If you cannot get in with your specialist yet, I recommend you be seen at one of our in person locations (see link below)   NOTE: There will be NO CHARGE for this E-Visit   If you are having a true medical emergency, please call 911.     For an urgent face to face visit, [place] has multiple urgent care centers for your convenience.  Click the link below for the full list of locations and hours, walk-in wait times, appointment scheduling options and driving directions:  Urgent Care - [place], [place], [place], [place], [place], [place]  [place]     Your MyChart E-visit questionnaire answers were reviewed by a board certified advanced clinical practitioner to complete your personal care plan based on your specific symptoms.    Thank you for using e-Visits.

## 2023-08-03 ENCOUNTER — Encounter

## 2023-08-27 ENCOUNTER — Ambulatory Visit: Admitting: Orthopedic Surgery

## 2023-09-02 ENCOUNTER — Telehealth: Admitting: Physician Assistant

## 2023-09-02 DIAGNOSIS — K047 Periapical abscess without sinus: Secondary | ICD-10-CM

## 2023-09-02 MED ORDER — AMOXICILLIN-POT CLAVULANATE 875-125 MG PO TABS: 1.0000 | ORAL_TABLET | Freq: Two times a day (BID) | ORAL | 0 refills | Status: DC

## 2023-09-02 NOTE — Progress Notes (Signed)
 E-Visit for Dental Pain  We are sorry that you are not feeling well.  Here is how we plan to help!  Based on what you have shared with me in the questionnaire, it sounds like you have a dental abscess. I have prescribed Augmentin  875-125mg  twice a day for 7 days. Continue OTC Tylenol  for pain since you cannot take NSAIDs (Advil , etc).  It is imperative that you see a dentist within 10 days of this eVisit to determine the cause of the dental pain and be sure it is adequately treated  A toothache or tooth pain is caused when the nerve in the root of a tooth or surrounding a tooth is irritated. Dental (tooth) infection, decay, injury, or loss of a tooth are the most common causes of dental pain. Pain may also occur after an extraction (tooth is pulled out). Pain sometimes originates from other areas and radiates to the jaw, thus appearing to be tooth pain.Bacteria growing inside your mouth can contribute to gum disease and dental decay, both of which can cause pain. A toothache occurs from inflammation of the central portion of the tooth called pulp. The pulp contains nerve endings that are very sensitive to pain. Inflammation to the pulp or pulpitis may be caused by dental cavities, trauma, and infection.    HOME CARE:   For toothaches: Over-the-counter pain medications such as acetaminophen  or ibuprofen  may be used. Take these as directed on the package while you arrange for a dental appointment. Avoid very cold or hot foods, because they may make the pain worse. You may get relief from biting on a cotton ball soaked in oil of cloves. You can get oil of cloves at most drug stores.  For jaw pain:  Aspirin may be helpful for problems in the joint of the jaw in adults. If pain happens every time you open your mouth widely, the temporomandibular joint (TMJ) may be the source of the pain. Yawning or taking a large bite of food may worsen the pain. An appointment with your doctor or dentist will help  you find the cause.     GET HELP RIGHT AWAY IF:  You have a high fever or chills If you have had a recent head or face injury and develop headache, light headedness, nausea, vomiting, or other symptoms that concern you after an injury to your face or mouth, you could have a more serious injury in addition to your dental injury. A facial rash associated with a toothache: This condition may improve with medication. Contact your doctor for them to decide what is appropriate. Any jaw pain occurring with chest pain: Although jaw pain is most commonly caused by dental disease, it is sometimes referred pain from other areas. People with heart disease, especially people who have had stents placed, people with diabetes, or those who have had heart surgery may have jaw pain as a symptom of heart attack or angina. If your jaw or tooth pain is associated with lightheadedness, sweating, or shortness of breath, you should see a doctor as soon as possible. Trouble swallowing or excessive pain or bleeding from gums: If you have a history of a weakened immune system, diabetes, or steroid use, you may be more susceptible to infections. Infections can often be more severe and extensive or caused by unusual organisms. Dental and gum infections in people with these conditions may require more aggressive treatment. An abscess may need draining or IV antibiotics, for example.  MAKE SURE YOU   Understand  these instructions. Will watch your condition. Will get help right away if you are not doing well or get worse.  Thank you for choosing an e-visit.  Your e-visit answers were reviewed by a board certified advanced clinical practitioner to complete your personal care plan. Depending upon the condition, your plan could have included both over the counter or prescription medications.  Please review your pharmacy choice. Make sure the pharmacy is open so you can pick up prescription now. If there is a problem, you may  contact your provider through [employer] and have the prescription routed to another pharmacy.  Your safety is important to us . If you have drug allergies check your prescription carefully.   For the next 24 hours you can use MyChart to ask questions about today's visit, request a non-urgent call back, or ask for a work or school excuse. You will get an email in the next two days asking about your experience. I hope that your e-visit has been valuable and will speed your recovery.

## 2023-09-02 NOTE — Progress Notes (Signed)
 I have spent 5 minutes in review of e-visit questionnaire, review and updating patient chart, medical decision making and response to patient.   Piedad Climes, PA-C

## 2023-09-04 ENCOUNTER — Ambulatory Visit: Admitting: Family Medicine

## 2023-09-04 DIAGNOSIS — D509 Iron deficiency anemia, unspecified: Secondary | ICD-10-CM | POA: Insufficient documentation

## 2023-09-04 DIAGNOSIS — M5430 Sciatica, unspecified side: Secondary | ICD-10-CM | POA: Insufficient documentation

## 2023-09-24 ENCOUNTER — Telehealth: Admitting: Physician Assistant

## 2023-09-24 DIAGNOSIS — W19XXXA Unspecified fall, initial encounter: Secondary | ICD-10-CM

## 2023-09-24 DIAGNOSIS — M549 Dorsalgia, unspecified: Secondary | ICD-10-CM

## 2023-09-24 NOTE — Progress Notes (Signed)
  Because of your fall and injury, I feel your condition warrants further evaluation and I recommend that you be seen in a face-to-face visit.   NOTE: There will be NO CHARGE for this E-Visit   If you are having a true medical emergency, please call 911.     For an urgent face to face visit, [place] has multiple urgent care centers for your convenience.  Click the link below for the full list of locations and hours, walk-in wait times, appointment scheduling options and driving directions:  Urgent Care - [place], [place], [place], [place], [place], [place]  [place]     Your MyChart E-visit questionnaire answers were reviewed by a board certified advanced clinical practitioner to complete your personal care plan based on your specific symptoms.    Thank you for using e-Visits.

## 2023-10-21 ENCOUNTER — Telehealth: Admitting: Physician Assistant

## 2023-10-21 DIAGNOSIS — M549 Dorsalgia, unspecified: Secondary | ICD-10-CM

## 2023-10-21 NOTE — Progress Notes (Signed)
  Because of multiple visits regarding back pain via e-visit in the past couple of months and need for more in-depth evaluation and management, I feel your condition warrants further evaluation and I recommend that you be seen in a face-to-face visit.   NOTE: There will be NO CHARGE for this E-Visit   If you are having a true medical emergency, please call 911.     For an urgent face to face visit, [place] has multiple urgent care centers for your convenience.  Click the link below for the full list of locations and hours, walk-in wait times, appointment scheduling options and driving directions:  Urgent Care - [place], [place], [place], [place], [place], [place]  [place]     Your MyChart E-visit questionnaire answers were reviewed by a board certified advanced clinical practitioner to complete your personal care plan based on your specific symptoms.    Thank you for using e-Visits.

## 2024-02-22 ENCOUNTER — Telehealth: Admitting: Physician Assistant

## 2024-02-22 DIAGNOSIS — J069 Acute upper respiratory infection, unspecified: Secondary | ICD-10-CM | POA: Diagnosis not present

## 2024-02-22 MED ORDER — NAPROXEN 500 MG PO TABS: 500.0000 mg | ORAL_TABLET | Freq: Two times a day (BID) | ORAL | 0 refills | Status: AC

## 2024-02-22 MED ORDER — BENZONATATE 100 MG PO CAPS: 100.0000 mg | ORAL_CAPSULE | Freq: Three times a day (TID) | ORAL | 0 refills | Status: AC | PRN

## 2024-02-22 MED ORDER — FLUTICASONE PROPIONATE 50 MCG/ACT NA SUSP: 2.0000 | Freq: Every day | NASAL | 0 refills | Status: AC

## 2024-02-22 NOTE — Progress Notes (Signed)
 E-Visit for COVID/Influenza Screening  Your current symptoms could be consistent with COVID-19 or Influenza. Please complete a COVID + Flu combination Test at home (available at all retail pharmacies), or check with your local pharmacy to see if they provide testing.   If you have tested positive for either COVID or Influenza, it means that you were infected with that particular virus and could give the virus to others.  Most people with these infections have a mild illness and can recover at home without medical care. Do not leave your home, except to get medical care.  DO not visit public areas and do not go to places where you are unable to wear a mask. It is important for you to stay home to take care of yourself and to help protect other people in your home and community.   Isolation Instructions:  You are to isolate at home for now until you have taken your home COVID/Flu test and notified our team of your results, at which time further isolation instructions will be given.  If you must be around other household members who do not have symptoms, you need to make sure that both you and the family members are masking consistently with a high-quality mask, even while in the home.  If you note any worsening of symptoms despite treatment, please seek an IN-PERSON evaluation ASAP. If you note any significant shortness of breath or any chest pain, please seek immediate ER evaluation. Please do not delay care!  Go to the nearest hospital ED for assessment if fever/cough/breathlessness are severe or illness seems like a threat to life.    The following symptoms may appear 2-14 days after exposure: Fever Cough Shortness of breath or difficulty breathing Chills Repeated shaking with chills Muscle pain Headache Sore throat New loss of taste or smell Fatigue Congestion or runny nose Nausea or vomiting Diarrhea   For symptoms,  I have prescribed Tessalon  Perles 100 mg. You may take 1-2  capsules every 8 hours as needed for cough, I have prescribed an anti-inflammatory - Naprosyn  500 mg. Take twice daily as needed for fever or body aches for 2 weeks, and I have prescribed Fluticasone  nasal spray 2 sprays in each nostril one time per dayasal spray 2 sprays in each nostril one time per day You may also take acetaminophen (Tylenol) as needed for fever.   Reduce your risk of any infection by using the same precautions used for avoiding the common cold or flu:  Wash your hands often with soap and warm water for at least 20 seconds.  If soap and water are not readily available, use an alcohol-based hand sanitizer with at least 60% alcohol.  If coughing or sneezing, cover your mouth and nose by coughing or sneezing into the elbow areas of your shirt or coat, into a tissue or into your sleeve (not your hands). Avoid shaking hands with others and consider head nods or verbal greetings only. Avoid touching your eyes, nose, or mouth with unwashed hands.  Avoid close contact with people who are sick. Avoid places or events with large numbers of people in one location, like concerts or sporting events. Carefully consider travel plans you have or are making. If you are planning any travel outside or inside the [place] , visit the CDC's Travelers' Health webpage for the latest health notices. If you have some symptoms but not all symptoms, continue to monitor at home and seek medical attention if your symptoms worsen. If you are having a  medical emergency, call 911.  HOME CARE Only take medications as instructed by your medical team. Drink plenty of fluids and get plenty of rest. A steam or ultrasonic humidifier can help if you have congestion.   GET HELP RIGHT AWAY IF YOU HAVE EMERGENCY WARNING SIGNS** FOR COVID-19. If you or someone is showing any of these signs seek emergency medical care immediately. Call 911 or proceed to your closest emergency facility if: You develop worsening high  fever. Trouble breathing. Bluish lips or face. Persistent pain or pressure in the chest. New confusion. Inability to wake or stay awake. You cough up blood. Your symptoms become more severe.  **This list is not all possible symptoms. Contact your medical provider for any symptoms that are sever or concerning to you.   MAKE SURE YOU  Understand these instructions. Will watch your condition. Will get help right away if you are not doing well or get worse.  Your e-visit answers were reviewed by a board certified advanced clinical practitioner to complete your personal care plan.  Depending on the condition, your plan could have included both over the counter or prescription medications.  If there is a problem, please reply once you have received a response from your provider.  Your safety is important to us .  If you have drug allergies check your prescription carefully.    You can use MyChart to ask questions about today's visit, request a non-urgent call back, or ask for a work or school excuse for 24 hours related to this e-Visit. If it has been greater than 24 hours you will need to follow up with your provider, or enter a new e-Visit to address those concerns. You will get an e-mail in the next two days asking about your experience.  I hope that your e-visit has been valuable and will speed your recovery. Thank you for using e-visits.   I have spent 5 minutes in review of e-visit questionnaire, review and updating patient chart, medical decision making and response to patient.   Delon CHRISTELLA Dickinson, PA-C
